# Patient Record
Sex: Female | Born: 1940 | Race: Black or African American | Hispanic: No | Smoking: Never smoker
Health system: Southern US, Community
[De-identification: ages and names within clinical notes are randomized; demographics above are authoritative.]

## PROBLEM LIST (undated history)

## (undated) DIAGNOSIS — C55 Malignant neoplasm of uterus, part unspecified: Secondary | ICD-10-CM

## (undated) DIAGNOSIS — I10 Essential (primary) hypertension: Secondary | ICD-10-CM

## (undated) DIAGNOSIS — E7800 Pure hypercholesterolemia, unspecified: Secondary | ICD-10-CM

## (undated) HISTORY — DX: Essential (primary) hypertension: I10

## (undated) HISTORY — PX: VAGINAL HYSTERECTOMY: SUR661

## (undated) HISTORY — DX: Pure hypercholesterolemia, unspecified: E78.00

## (undated) HISTORY — DX: Malignant neoplasm of uterus, part unspecified: C55

---

## 2006-11-12 ENCOUNTER — Emergency Department (HOSPITAL_COMMUNITY): Admission: EM | Admit: 2006-11-12 | Discharge: 2006-11-12 | Payer: Self-pay | Admitting: Emergency Medicine

## 2008-03-01 ENCOUNTER — Emergency Department (HOSPITAL_COMMUNITY): Admission: EM | Admit: 2008-03-01 | Discharge: 2008-03-01 | Payer: Self-pay | Admitting: Emergency Medicine

## 2010-08-28 ENCOUNTER — Emergency Department (HOSPITAL_COMMUNITY): Admission: EM | Admit: 2010-08-28 | Discharge: 2010-08-28 | Payer: Self-pay | Admitting: Emergency Medicine

## 2010-09-08 ENCOUNTER — Emergency Department (HOSPITAL_COMMUNITY): Admission: EM | Admit: 2010-09-08 | Discharge: 2010-09-08 | Payer: Self-pay | Admitting: Emergency Medicine

## 2011-01-04 LAB — CBC
HCT: 36.3 % (ref 36.0–46.0)
HCT: 38.6 % (ref 36.0–46.0)
Hemoglobin: 12.3 g/dL (ref 12.0–15.0)
Hemoglobin: 13 g/dL (ref 12.0–15.0)
MCH: 28.6 pg (ref 26.0–34.0)
MCH: 28.9 pg (ref 26.0–34.0)
MCHC: 33.9 g/dL (ref 30.0–36.0)
RBC: 4.54 MIL/uL (ref 3.87–5.11)

## 2011-01-04 LAB — BASIC METABOLIC PANEL
BUN: 10 mg/dL (ref 6–23)
CO2: 26 mEq/L (ref 19–32)
CO2: 28 mEq/L (ref 19–32)
Calcium: 9.6 mg/dL (ref 8.4–10.5)
Calcium: 9.9 mg/dL (ref 8.4–10.5)
Chloride: 100 mEq/L (ref 96–112)
GFR calc Af Amer: 45 mL/min — ABNORMAL LOW (ref >60)
Glucose, Bld: 124 mg/dL — ABNORMAL HIGH (ref 70–99)
Glucose, Bld: 135 mg/dL — ABNORMAL HIGH (ref 70–99)
Potassium: 2.8 mEq/L — ABNORMAL LOW (ref 3.5–5.1)
Potassium: 2.9 mEq/L — ABNORMAL LOW (ref 3.5–5.1)
Sodium: 138 mEq/L (ref 135–145)
Sodium: 138 mEq/L (ref 135–145)

## 2011-01-04 LAB — DIFFERENTIAL
Basophils Relative: 1 % (ref 0–1)
Eosinophils Absolute: 0 10*3/uL (ref 0.0–0.7)
Eosinophils Absolute: 0.1 10*3/uL (ref 0.0–0.7)
Lymphs Abs: 1.5 10*3/uL (ref 0.7–4.0)
Monocytes Absolute: 0.4 10*3/uL (ref 0.1–1.0)
Monocytes Absolute: 0.7 10*3/uL (ref 0.1–1.0)
Monocytes Relative: 4 % (ref 3–12)
Monocytes Relative: 8 % (ref 3–12)
Neutrophils Relative %: 83 % — ABNORMAL HIGH (ref 43–77)

## 2011-11-21 IMAGING — CR DG CHEST 1V PORT
1 series · 1 of 1 positions shown · non-contrast
Comparison: None.

CLINICAL DATA: Hypertension and nosebleed.

PORTABLE CHEST - 1 VIEW

[view not recorded]
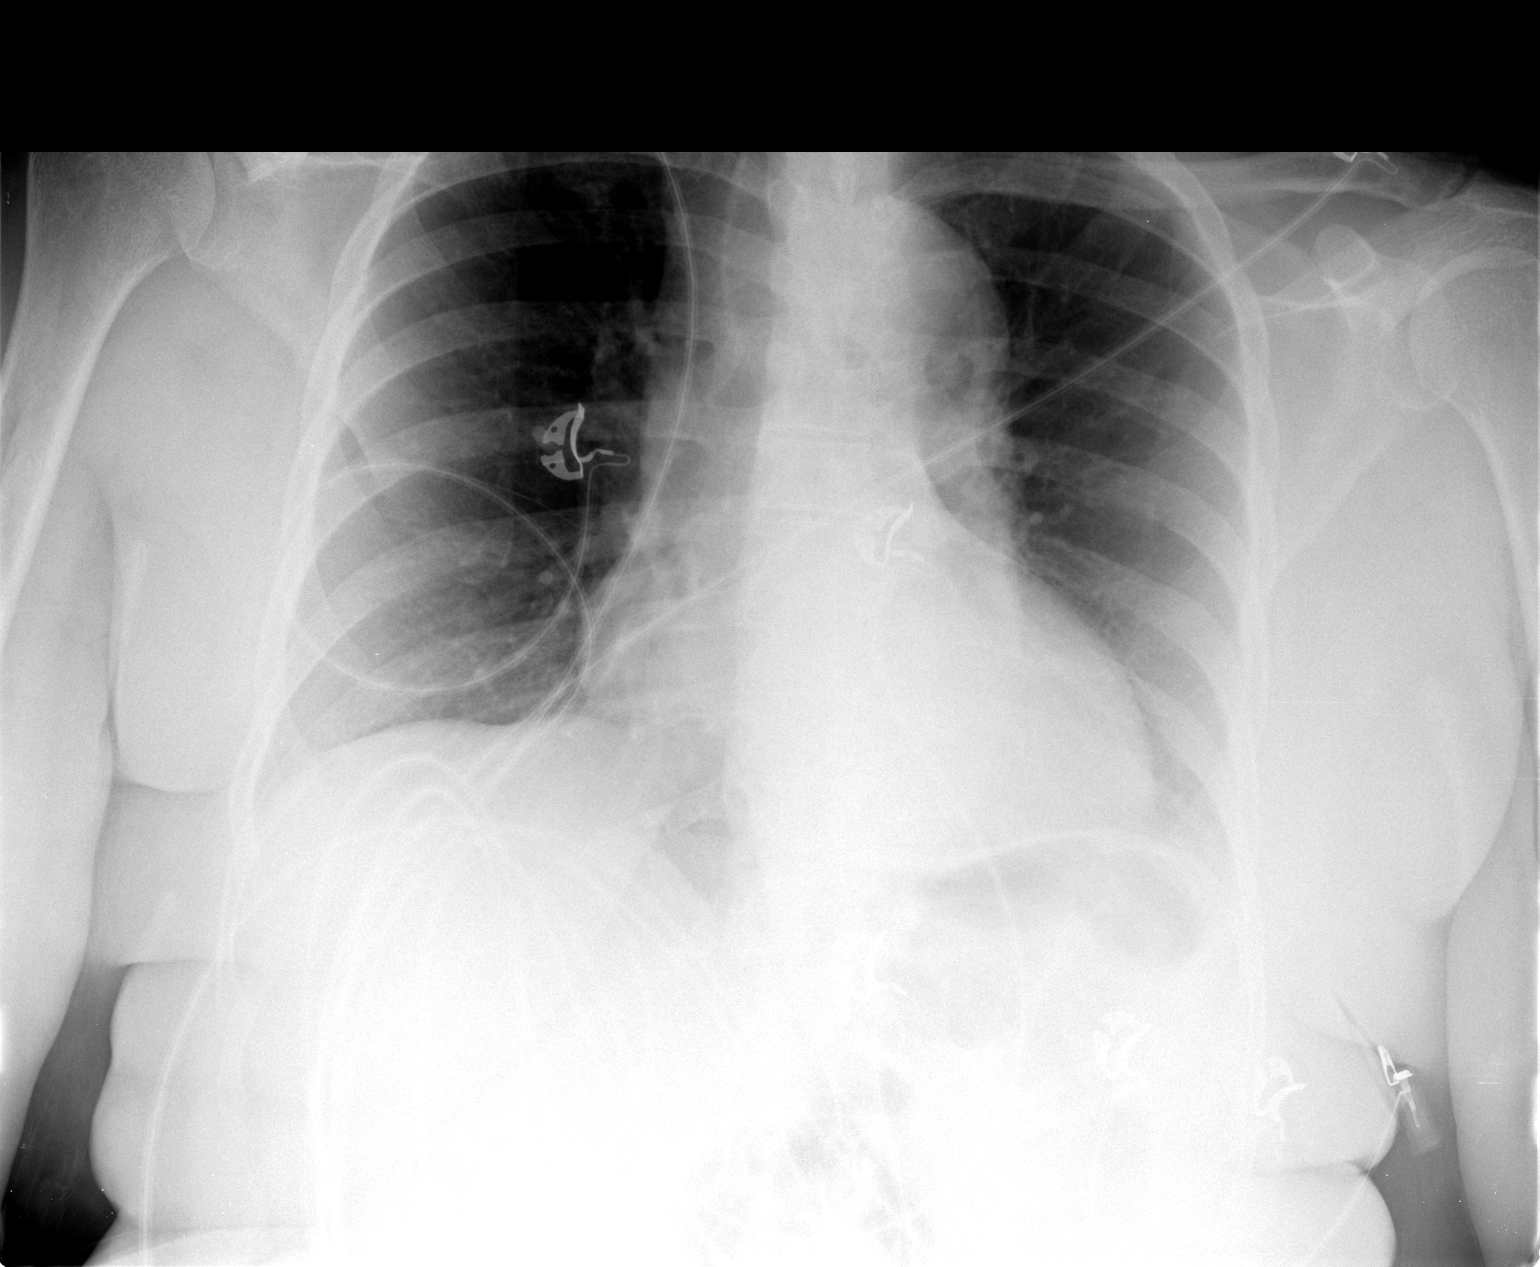

[1 of 1 positions shown; findings below may reference images not displayed]

FINDINGS: No evidence of pulmonary edema, infiltrate or pleural
fluid.  Thoracic aorta shows ectasia.  Heart size is normal.
IMPRESSION: No active disease.

## 2015-11-09 ENCOUNTER — Encounter: Payer: Self-pay | Admitting: Gastroenterology

## 2015-12-07 ENCOUNTER — Ambulatory Visit (AMBULATORY_SURGERY_CENTER): Payer: Self-pay

## 2015-12-07 VITALS — Ht 65.0 in | Wt 174.8 lb

## 2015-12-07 DIAGNOSIS — Z1211 Encounter for screening for malignant neoplasm of colon: Secondary | ICD-10-CM

## 2015-12-07 MED ORDER — SUPREP BOWEL PREP KIT 17.5-3.13-1.6 GM/177ML PO SOLN: 1.0000 | Freq: Once | ORAL | Status: DC

## 2015-12-07 NOTE — Progress Notes (Signed)
No allergies to eggs or soy No past problems with anesthesia No home oxygen No diet/weight loss meds  Has email and internet; registered for emmi 

## 2015-12-21 ENCOUNTER — Ambulatory Visit (AMBULATORY_SURGERY_CENTER): Payer: Medicare HMO | Admitting: Gastroenterology

## 2015-12-21 ENCOUNTER — Encounter: Payer: Self-pay | Admitting: Gastroenterology

## 2015-12-21 VITALS — BP 128/85 | HR 75 | Temp 98.3°F | Resp 18 | Ht 65.0 in | Wt 174.0 lb

## 2015-12-21 DIAGNOSIS — Z1211 Encounter for screening for malignant neoplasm of colon: Secondary | ICD-10-CM

## 2015-12-21 MED ORDER — SODIUM CHLORIDE 0.9 % IV SOLN: 500.0000 mL | INTRAVENOUS | Status: DC

## 2015-12-21 NOTE — Progress Notes (Signed)
A/ox3, pleased with MAC, report to RN 

## 2015-12-21 NOTE — Op Note (Signed)
[place]  [address]. [place], [id number]   COLONOSCOPY PROCEDURE REPORT  PATIENT: Katelyn, [facility]  MR#: [id number] BIRTHDATE: [birth year] , 74  yrs. old GENDER: female ENDOSCOPIST: Wilfrid Lund, MD REFERRED BY: PROCEDURE DATE:  12/21/2015 PROCEDURE:   Colonoscopy, screening First Screening Colonoscopy - Avg.  risk and is 50 yrs.  old or older Yes.  Prior Negative Screening - Now for repeat screening. N/A  History of Adenoma - Now for follow-up colonoscopy & has been > or = to 3 yrs.  N/A  Polyps removed today? No ASA CLASS:   Class II INDICATIONS:Screening for colonic neoplasia and Colorectal Neoplasm Risk Assessment for this procedure is average risk. MEDICATIONS: Monitored anesthesia care and Propofol 250 mg IV  DESCRIPTION OF PROCEDURE:   After the risks benefits and alternatives of the procedure were thoroughly explained, informed consent was obtained.  The digital rectal exam revealed no abnormalities of the rectum.   The LB [id number]  endoscope was introduced through the anus and advanced to the cecum, which was identified by both the appendix and ileocecal valve. No adverse events experienced.   The quality of the prep was excellent. (Suprep was used)  The instrument was then slowly withdrawn as the colon was fully examined. Estimated blood loss is zero unless otherwise noted in this procedure report.      COLON FINDINGS: There was moderate diverticulosis noted in the left colon and right colon.   Small Grade I hemorrhoids were found. Retroflexed views revealed internal Grade I hemorrhoids. The time to cecum =  6.30     Withdrawal time = 8.1   The scope was withdrawn and the procedure completed. COMPLICATIONS: There were no immediate complications.  ENDOSCOPIC IMPRESSION: 1.   There was moderate diverticulosis noted in the left colon and right colon 2.   Small Grade I hemorrhoids  RECOMMENDATIONS: Given your age, you will not need  another colonoscopy for colon cancer screening or polyp surveillance.  These types of tests usually stop around the age 42.  eSigned:  Wilfrid Lund, MD 12/21/2015 10:02 AM   cc:

## 2015-12-21 NOTE — Patient Instructions (Signed)
  Handout s given for Diverticulosis and Hemorroids.  YOU HAD AN ENDOSCOPIC PROCEDURE TODAY AT [facility]:   Refer to the procedure report that was given to you for any specific questions about what was found during the examination.  If the procedure report does not answer your questions, please call your gastroenterologist to clarify.  If you requested that your care partner not be given the details of your procedure findings, then the procedure report has been included in a sealed envelope for you to review at your convenience later.  YOU SHOULD EXPECT: Some feelings of bloating in the abdomen. Passage of more gas than usual.  Walking can help get rid of the air that was put into your GI tract during the procedure and reduce the bloating. If you had a lower endoscopy (such as a colonoscopy or flexible sigmoidoscopy) you may notice spotting of blood in your stool or on the toilet paper. If you underwent a bowel prep for your procedure, you may not have a normal bowel movement for a few days.  Please Note:  You might notice some irritation and congestion in your nose or some drainage.  This is from the oxygen used during your procedure.  There is no need for concern and it should clear up in a day or so.  SYMPTOMS TO REPORT IMMEDIATELY:   Following lower endoscopy (colonoscopy or flexible sigmoidoscopy):  Excessive amounts of blood in the stool  Significant tenderness or worsening of abdominal pains  Swelling of the abdomen that is new, acute  Fever of 100F or higher   For urgent or emergent issues, a gastroenterologist can be reached at any hour by calling [phone].   DIET: Your first meal following the procedure should be a small meal and then it is ok to progress to your normal diet. Heavy or fried foods are harder to digest and may make you feel nauseous or bloated.  Likewise, meals heavy in dairy and vegetables can increase bloating.  Drink plenty of fluids but you  should avoid alcoholic beverages for 24 hours.  ACTIVITY:  You should plan to take it easy for the rest of today and you should NOT DRIVE or use heavy machinery until tomorrow (because of the sedation medicines used during the test).    FOLLOW UP: Our staff will call the number listed on your records the next business day following your procedure to check on you and address any questions or concerns that you may have regarding the information given to you following your procedure. If we do not reach you, we will leave a message.  However, if you are feeling well and you are not experiencing any problems, there is no need to return our call.  We will assume that you have returned to your regular daily activities without incident.  If any biopsies were taken you will be contacted by phone or by letter within the next 1-3 weeks.  Please call us at [phone] if you have not heard about the biopsies in 3 weeks.    SIGNATURES/CONFIDENTIALITY: You and/or your care partner have signed paperwork which will be entered into your electronic medical record.  These signatures attest to the fact that that the information above on your After Visit Summary has been reviewed and is understood.  Full responsibility of the confidentiality of this discharge information lies with you and/or your care-partner.

## 2015-12-22 ENCOUNTER — Telehealth: Payer: Self-pay | Admitting: *Deleted

## 2015-12-22 NOTE — Telephone Encounter (Signed)
Message left

## 2023-07-12 ENCOUNTER — Emergency Department (HOSPITAL_COMMUNITY): Payer: Medicare HMO

## 2023-07-12 ENCOUNTER — Other Ambulatory Visit: Payer: Self-pay

## 2023-07-12 ENCOUNTER — Emergency Department (HOSPITAL_COMMUNITY)
Admission: EM | Admit: 2023-07-12 | Discharge: 2023-07-13 | Disposition: A | Discharge status: Home / Self Care | Payer: Medicare HMO | Attending: Emergency Medicine | Admitting: Emergency Medicine

## 2023-07-12 ENCOUNTER — Ambulatory Visit
Admission: EM | Admit: 2023-07-12 | Discharge: 2023-07-12 | Disposition: A | Discharge status: Home / Self Care | Payer: Medicare HMO | Attending: Internal Medicine | Admitting: Internal Medicine

## 2023-07-12 ENCOUNTER — Encounter (HOSPITAL_COMMUNITY): Payer: Self-pay | Admitting: Emergency Medicine

## 2023-07-12 DIAGNOSIS — I160 Hypertensive urgency: Secondary | ICD-10-CM

## 2023-07-12 DIAGNOSIS — Z8542 Personal history of malignant neoplasm of other parts of uterus: Secondary | ICD-10-CM | POA: Diagnosis not present

## 2023-07-12 DIAGNOSIS — I10 Essential (primary) hypertension: Secondary | ICD-10-CM | POA: Diagnosis not present

## 2023-07-12 DIAGNOSIS — Z79899 Other long term (current) drug therapy: Secondary | ICD-10-CM | POA: Insufficient documentation

## 2023-07-12 DIAGNOSIS — R519 Headache, unspecified: Secondary | ICD-10-CM | POA: Diagnosis present

## 2023-07-12 LAB — CBC WITH DIFFERENTIAL/PLATELET
Abs Immature Granulocytes: 0.03 10*3/uL (ref 0.00–0.07)
Basophils Absolute: 0 10*3/uL (ref 0.0–0.1)
Basophils Relative: 1 %
Eosinophils Absolute: 0.1 10*3/uL (ref 0.0–0.5)
Eosinophils Relative: 1 %
HCT: 45 % (ref 36.0–46.0)
Hemoglobin: 14.7 g/dL (ref 12.0–15.0)
Immature Granulocytes: 0 %
Lymphocytes Relative: 28 %
Lymphs Abs: 2.4 10*3/uL (ref 0.7–4.0)
MCH: 29 pg (ref 26.0–34.0)
MCHC: 32.7 g/dL (ref 30.0–36.0)
MCV: 88.8 fL (ref 80.0–100.0)
Monocytes Absolute: 0.6 10*3/uL (ref 0.1–1.0)
Monocytes Relative: 7 %
Neutro Abs: 5.3 10*3/uL (ref 1.7–7.7)
Neutrophils Relative %: 63 %
Platelets: 243 10*3/uL (ref 150–400)
RBC: 5.07 MIL/uL (ref 3.87–5.11)
RDW: 14.3 % (ref 11.5–15.5)
WBC: 8.5 10*3/uL (ref 4.0–10.5)
nRBC: 0 % (ref 0.0–0.2)

## 2023-07-12 LAB — COMPREHENSIVE METABOLIC PANEL WITH GFR
ALT: 20 U/L (ref 0–44)
AST: 26 U/L (ref 15–41)
Albumin: 3.8 g/dL (ref 3.5–5.0)
Alkaline Phosphatase: 51 U/L (ref 38–126)
Anion gap: 12 (ref 5–15)
BUN: 11 mg/dL (ref 8–23)
CO2: 26 mmol/L (ref 22–32)
Calcium: 9.3 mg/dL (ref 8.9–10.3)
Chloride: 100 mmol/L (ref 98–111)
Creatinine, Ser: 1.46 mg/dL — ABNORMAL HIGH (ref 0.44–1.00)
GFR, Estimated: 36 mL/min — ABNORMAL LOW (ref >60)
Glucose, Bld: 117 mg/dL — ABNORMAL HIGH (ref 70–99)
Potassium: 3.3 mmol/L — ABNORMAL LOW (ref 3.5–5.1)
Sodium: 138 mmol/L (ref 135–145)
Total Bilirubin: 1.2 mg/dL (ref 0.3–1.2)
Total Protein: 7.6 g/dL (ref 6.5–8.1)

## 2023-07-12 MED ORDER — AMLODIPINE BESYLATE 5 MG PO TABS: 5.0000 mg | ORAL_TABLET | Freq: Every day | ORAL | 1 refills | Status: DC

## 2023-07-12 MED ORDER — HYDRALAZINE HCL 20 MG/ML IJ SOLN
10.0000 mg | Freq: Once | INTRAMUSCULAR | Status: AC
  Administered 2023-07-12: 10 mg via INTRAVENOUS
  Filled 2023-07-12: qty 1

## 2023-07-12 MED ORDER — METOPROLOL SUCCINATE ER 25 MG PO TB24: 25.0000 mg | ORAL_TABLET | Freq: Every day | ORAL | 1 refills | Status: DC

## 2023-07-12 NOTE — ED Provider Triage Note (Signed)
Emergency Medicine Provider Triage Evaluation Note  Katelyn [facility] , a 82 y.o. female  was evaluated in triage.  Pt complains of hypertension.  Presents from urgent care.  She has been compliant with all of her medications.  She reports a mild headache but denies any vision changes, numbness, weakness, tingling, chest pain, shortness of breath, facial droop, slurred speech, difficulty walking.  Elevated blood pressure began 2 days ago  Review of Systems  Positive: As above Negative: As above  Physical Exam  BP (!) 212/124 (BP Location: Right Arm)   Pulse 80   Temp 98.7 F (37.1 C) (Oral)   Resp 18   Ht 5\' 5"  (1.651 m)   Wt 78.9 kg   SpO2 96%   BMI 28.96 kg/m  Gen:   Awake, no distress   Resp:  Normal effort  MSK:   Moves extremities without difficulty  Other:    MENTAL STATUS: AAOx3   LANG/SPEECH: Fluent, intact naming, repetition & comprehension   CRANIAL NERVES:   II: Pupils equal and reactive   III, IV, VI: EOM intact, no gaze preference or deviation, no nystagmus   V: normal sensation of the face   VII: no facial asymmetry   VIII: normal hearing to speech   MOTOR: 5/5 in both upper and lower extremities   SENSORY: Normal to touch in all extremiteis   COORD: Normal finger to nose, heel to shin and shoulder shrug, no tremor, no dysmetria. No pronator drift   Medical Decision Making  Medically screening exam initiated at 7:21 PM.  Appropriate orders placed.  Katelyn Pittman was informed that the remainder of the evaluation will be completed by another provider, this initial triage assessment does not replace that evaluation, and the importance of remaining in the ED until their evaluation is complete.  Workup initiated   Katelyn Pittman 07/12/23 1921

## 2023-07-12 NOTE — ED Provider Notes (Signed)
[place] EMER[facility]ENC[facility] DE[facility]MENT AT [facility] Provider Note   CSN: [id number] Arrival date & time: 07/12/23  1835     History  Chief Complaint  Patient presents with   Hypertension    Katelyn [facility] is a 82 y.o. female with past medical history significant for hypertension, previous uterine cancer who presents concern for significantly elevated blood pressure, systolic greater than 200, diastolic greater than 100 for the last 2 days.  She reports some slight headache onset today but denies any new numbness, weakness, tingling, chest pain, shortness of breath.  Denies any decreased urination.  She takes lisinopril 2.5mg  and reports that she has been taking this medication. Some grief related to loss of husband within the last year that she thinks may be contributing.   Hypertension       Home Medications Prior to Admission medications   Medication Sig Start Date End Date Taking? Authorizing Provider  amLODipine (NORVASC) 5 MG tablet Take 1 tablet (5 mg total) by mouth daily. 07/12/23  Yes Minnah Llamas H, PA-C  lisinopril (ZESTRIL) 2.5 MG tablet Take 1 tablet by mouth daily. 02/20/23   [provider]  metoprolol succinate (TOPROL-XL) 25 MG 24 hr tablet Take 1 tablet (25 mg total) by mouth at bedtime. 07/12/23   Effie Wahlert H, PA-C  rosuvastatin (CRESTOR) 20 MG tablet TAKE 1 TABLET BY MOUTH EVERY DAY. STOP SIMVASTATIN    [provider]      Allergies    Patient has no known allergies.    Review of Systems   Review of Systems  All other systems reviewed and are negative.   Physical Exam Updated Vital Signs BP (!) 157/102   Pulse 70   Temp 98.7 F (37.1 C) (Oral)   Resp 18   Ht 5\' 5"  (1.651 m)   Wt 78.9 kg   SpO2 97%   BMI 28.96 kg/m  Physical Exam Vitals and nursing note reviewed.  Constitutional:      General: She is not in acute distress.    Appearance: Normal appearance.  HENT:     Head: Normocephalic and atraumatic.   Eyes:     General:        Right eye: No discharge.        Left eye: No discharge.  Cardiovascular:     Rate and Rhythm: Normal rate and regular rhythm.     Heart sounds: No murmur heard.    No friction rub. No gallop.  Pulmonary:     Effort: Pulmonary effort is normal.     Breath sounds: Normal breath sounds.  Abdominal:     General: Bowel sounds are normal.     Palpations: Abdomen is soft.  Skin:    General: Skin is warm and dry.     Capillary Refill: Capillary refill takes less than 2 seconds.  Neurological:     Mental Status: She is alert and oriented to person, place, and time.     Comments: Moves all 4 limbs spontaneously, CN II through XII grossly intact, can ambulate without difficulty, intact sensation throughout.  Psychiatric:        Mood and Affect: Mood normal.        Behavior: Behavior normal.     ED Results / Procedures / Treatments   Labs (all labs ordered are listed, but only abnormal results are displayed) Labs Reviewed  COMPREHENSIVE METABOLIC PANEL - Abnormal; Notable for the following components:      Result Value   Potassium  3.3 (*)    Glucose, Bld 117 (*)    Creatinine, Ser 1.46 (*)    GFR, Estimated 36 (*)    All other components within normal limits  CBC WITH DIFFERENTIAL/PLATELET    EKG EKG Interpretation Date/Time:  Wednesday July 12 2023 21:36:25 EDT Ventricular Rate:  73 PR Interval:  173 QRS Duration:  92 QT Interval:  414 QTC Calculation: 457 R Axis:   -21  Text Interpretation: Sinus rhythm Borderline left axis deviation Anteroseptal infarct, old Confirmed by Alvino Blood ([id number]) on 07/12/2023 10:51:11 PM  Radiology CT Head Wo Contrast  Result Date: 07/12/2023 CLINICAL DATA:  Hypertension, headache, new onset EXAM: CT HEAD WITHOUT CONTRAST TECHNIQUE: Contiguous axial images were obtained from the base of the skull through the vertex without intravenous contrast. RADIATION DOSE REDUCTION: This exam was performed according  to the departmental dose-optimization program which includes automated exposure control, adjustment of the mA and/or kV according to patient size and/or use of iterative reconstruction technique. COMPARISON:  None Available. FINDINGS: Brain: No evidence of acute infarction, hemorrhage, mass, mass effect, or midline shift. No hydrocephalus or extra-axial fluid collection. Partial empty sella. Normal craniocervical junction. Better than expected cerebral volume for age. Gray-white differentiation is preserved. The basilar cisterns are patent. Vascular: No hyperdense vessel. Atherosclerotic calcifications in the intracranial carotid and vertebral arteries. Skull: Negative for fracture or focal lesion. Sinuses/Orbits: No acute finding.  Prior right lens replacement. Other: The mastoid air cells are well aerated. IMPRESSION: No acute intracranial process. Electronically Signed   By: Wiliam Ke M.D.   On: 07/12/2023 23:03    Procedures Procedures    Medications Ordered in ED Medications  hydrALAZINE (APRESOLINE) injection 10 mg (10 mg Intravenous Given 07/12/23 2235)    ED Course/ Medical Decision Making/ A&P                                 Medical Decision Making Amount and/or Complexity of Data Reviewed Radiology: ordered.  Risk Prescription drug management.   Medical Decision Making:   Crystianna [facility] is a 82 y.o. female who presented to the ED today with elevated blood pressure, mild headache detailed above.    Additional history discussed with patient's family/caregivers.  Patient's presentation is complicated by their history of uterine cancer, previous significant HTN.  Patient placed on continuous vitals and telemetry monitoring while in ED which was reviewed periodically.  Complete initial physical exam performed, notably the patient  was without any focal neurologic deficits, severely hypertensive on arrival, blood pressure 212/118, vital signs otherwise stable..    Reviewed and  confirmed nursing documentation for past medical history, family history, social history.    Initial Assessment:   With the patient's presentation of elevated blood pressure readings, most likely diagnosis is hypertensive urgency. Other diagnoses associated with hypertensive emergency were considered including (but not limited to) intracranial hemorrhage, acute renal artery stenosis, acute kidney injury, myocardial stress, ophthalmologic emergencies. These are considered less likely due to history of present illness and physical exam findings.   This is most consistent with an acute life/limb threatening illness complicated by underlying chronic conditions. Will evaluate for hypertensive emergency as below.  Initial Plan:   Screening labs including CBC and Metabolic panel to evaluate for infectious or metabolic etiology of disease.  Given headache, eval for ICH with CTH EKG to evaluate for cardiac pathology. Objective evaluation as below reviewed. Considered further administration of antihypertensives in ED, per  consensus guidelines for [employer] of emergency physicians, acute treatment of hypertensive urgency alone in the emergency department is not recommended.  If patient has evidence of hypertensive emergency on objective laboratory evaluation, will reevaluate.  Will monitor blood pressure while patient awaiting above laboratory studies.  Initial Study Results:   Laboratory  All laboratory results reviewed without evidence of clinically relevant pathology.   Exceptions include: Creatinine elevated 1.46 but overall stable compared to baseline, mild hypokalemia, potassium 3.3, encourage close PCP repeat check.  EKG EKG was reviewed independently. Rate, rhythm, axis, intervals all examined and without medically relevant abnormality. ST segments without concerns for elevations.    Radiology:  All images reviewed independently. Agree with radiology report at this time.   CT Head Wo  Contrast  Result Date: 07/12/2023 CLINICAL DATA:  Hypertension, headache, new onset EXAM: CT HEAD WITHOUT CONTRAST TECHNIQUE: Contiguous axial images were obtained from the base of the skull through the vertex without intravenous contrast. RADIATION DOSE REDUCTION: This exam was performed according to the departmental dose-optimization program which includes automated exposure control, adjustment of the mA and/or kV according to patient size and/or use of iterative reconstruction technique. COMPARISON:  None Available. FINDINGS: Brain: No evidence of acute infarction, hemorrhage, mass, mass effect, or midline shift. No hydrocephalus or extra-axial fluid collection. Partial empty sella. Normal craniocervical junction. Better than expected cerebral volume for age. Gray-white differentiation is preserved. The basilar cisterns are patent. Vascular: No hyperdense vessel. Atherosclerotic calcifications in the intracranial carotid and vertebral arteries. Skull: Negative for fracture or focal lesion. Sinuses/Orbits: No acute finding.  Prior right lens replacement. Other: The mastoid air cells are well aerated. IMPRESSION: No acute intracranial process. Electronically Signed   By: Wiliam Ke M.D.   On: 07/12/2023 23:03      Consults: Case discussed with attending physician, Dr. Suezanne Jacquet who assessed patient at bedside.   Reassessment and Plan:   After hydralazine 10 mg patient was significant improvement in blood pressure, repeat 157/102.  She no longer has a headache.  She continues to have no neurologic deficits, chest pain, shortness of breath.  We will increase her amlodipine 2.5 to 5 mg daily, as well as place her back on the extended release metoprolol which she was previously taking.  Encouraged close PCP follow-up, and blood pressure monitoring at home.  Final Clinical Impression(s) / ED Diagnoses Final diagnoses:  Hypertensive urgency  Poorly-controlled hypertension    Rx / DC Orders ED Discharge  Orders          Ordered    amLODipine (NORVASC) 5 MG tablet  Daily        07/12/23 2347    metoprolol succinate (TOPROL-XL) 25 MG 24 hr tablet  Daily at bedtime        07/12/23 2347              Tran Randle, Harrel Carina, PA-C 07/12/23 2351    Lonell Grandchild, MD 07/13/23 1816

## 2023-07-12 NOTE — ED Provider Notes (Signed)
[facility]  Note:  This document was prepared using Conservation officer, historic buildings and may include unintentional dictation errors.  MRN: [id number] DOB: June 16, 1941  Subjective:   Katelyn [facility] is a 82 y.o. female presenting for 2-day history of persistently elevated blood pressure greater than 200 systolic 100 diastolic. Patient reports that normally her blood pressure readings are 150s systolic over 80s diastolic.  She has also had left-sided headache.  No weakness, numbness or tingling, vision changes, confusion, falls, chest pain, heart racing or palpitations.  Patient is currently taking lisinopril 2.5 mg and metoprolol.  She has previously taken losartan, hydrochlorothiazide and amlodipine but has not been on these medications.  No history of heart disease, cerebrovascular disease, TIA, stroke, aneurysm.  No current facility-administered medications for this encounter.  Current Outpatient Medications:    lisinopril (ZESTRIL) 2.5 MG tablet, Take 1 tablet by mouth daily., Disp: , Rfl:    metoprolol succinate (TOPROL-XL) 25 MG 24 hr tablet, TAKE 1 TABLET BY MOUTH EVERY DAY AT BEDTIME FOR 30 DAYS, Disp: , Rfl:    rosuvastatin (CRESTOR) 20 MG tablet, TAKE 1 TABLET BY MOUTH EVERY DAY. STOP SIMVASTATIN, Disp: , Rfl:    No Known Allergies  Past Medical History:  Diagnosis Date   Hypertension    Uterine cancer (HCC)      Past Surgical History:  Procedure Laterality Date   VAGINAL HYSTERECTOMY      Family History  Problem Relation Age of Onset   Colon cancer Neg Hx    Esophageal cancer Neg Hx    Stomach cancer Neg Hx    Rectal cancer Neg Hx     Social History   Tobacco Use   Smoking status: Never   Smokeless tobacco: Never  Vaping Use   Vaping status: Never Used  Substance Use Topics   Alcohol use: No    Alcohol/week: 0.0 standard drinks of alcohol   Drug use: No    ROS   Objective:   Vitals: BP (!) 222/119 (BP Location: Right Arm)    Pulse 77   Temp 98.1 F (36.7 C) (Oral)   Resp 20   SpO2 95%   BP Readings from Last 3 Encounters:  07/12/23 (!) 222/119  12/21/15 128/85   Physical Exam Constitutional:      General: She is not in acute distress.    Appearance: Normal appearance. She is well-developed. She is not ill-appearing, toxic-appearing or diaphoretic.  HENT:     Head: Normocephalic and atraumatic.     Right Ear: External ear normal.     Left Ear: External ear normal.     Nose: Nose normal.     Mouth/Throat:     Mouth: Mucous membranes are moist.  Eyes:     General: No scleral icterus.       Right eye: No discharge.        Left eye: No discharge.     Extraocular Movements: Extraocular movements intact.     Pupils: Pupils are equal, round, and reactive to light.  Neck:     Meningeal: Brudzinski's sign and Kernig's sign absent.  Cardiovascular:     Rate and Rhythm: Normal rate and regular rhythm.     Heart sounds: Normal heart sounds. No murmur heard.    No friction rub. No gallop.  Pulmonary:     Effort: Pulmonary effort is normal. No respiratory distress.     Breath sounds: No stridor. No wheezing, rhonchi or rales.  Chest:  Chest wall: No tenderness.  Skin:    General: Skin is warm and dry.  Neurological:     General: No focal deficit present.     Mental Status: She is alert and oriented to person, place, and time.     Cranial Nerves: No cranial nerve deficit, dysarthria or facial asymmetry.     Motor: No weakness or pronator drift.     Coordination: Romberg sign negative. Coordination normal. Finger-Nose-Finger Test and Heel to [facility] Test normal. Rapid alternating movements normal.     Gait: Gait and tandem walk normal.     Deep Tendon Reflexes: Reflexes normal.  Psychiatric:        Mood and Affect: Mood normal.        Behavior: Behavior normal.        Thought Content: Thought content normal.        Judgment: Judgment normal.     Assessment and Plan :   PDMP not reviewed this  encounter.  1. Hypertensive urgency    Patient has a reassuring neurologic exam.  However, she is in need of a higher level of testing and care than we can provide in the urgent care setting.  She has severely elevated blood pressure and has developed a headache.  Differential does include hypertensive urgency, hypertensive emergency, TIA, stroke, aneurysm, acute encephalopathy.  I discussed this with patient and her daughter.  Will defer transport by EMS.  Patient is to present to the emergency room by personal vehicle.   Wallis Bamberg, [place] 07/12/23 1610

## 2023-07-12 NOTE — ED Triage Notes (Signed)
Patient arrives ambulatory by POV sent from UC for hypertension. Patient reports slight headache onset of today. Denies any numbness, weakness or tingling.

## 2023-07-12 NOTE — ED Triage Notes (Signed)
Pt states she checks BP daily-has been elevated x 2 days-NAD-steady gait

## 2023-07-12 NOTE — Discharge Instructions (Signed)
Please head to the emergency room now as you are in need of a higher level of care than we can provide in the urgent care setting. I am concerned that you have hypertensive urgency, signs of an TIA, stroke or aneurysm among other medical emergencies. This requires emergency level testing and care. Please head to the [facility] ER now.

## 2023-07-12 NOTE — Discharge Instructions (Addendum)
Please begin taking the new blood pressure medications that we have prescribed, recommend that you follow-up close with your primary care doctor, and keep a log of your blood pressures over the next month to see whether or not your blood pressure is responding adequately to the new blood pressure medications we have put you on.  If you have chest pain, develop new or worsening headache please return to the emergency department for further evaluation and management.

## 2023-07-12 NOTE — ED Notes (Signed)
Patient transported to CT 

## 2023-07-24 ENCOUNTER — Ambulatory Visit (INDEPENDENT_AMBULATORY_CARE_PROVIDER_SITE_OTHER): Payer: Medicare HMO | Admitting: Nurse Practitioner

## 2023-07-24 ENCOUNTER — Encounter: Payer: Self-pay | Admitting: Nurse Practitioner

## 2023-07-24 VITALS — BP 138/78 | HR 82 | Temp 97.3°F | Resp 18 | Ht 64.0 in | Wt 173.0 lb

## 2023-07-24 DIAGNOSIS — R413 Other amnesia: Secondary | ICD-10-CM | POA: Diagnosis not present

## 2023-07-24 DIAGNOSIS — I10 Essential (primary) hypertension: Secondary | ICD-10-CM | POA: Diagnosis not present

## 2023-07-24 DIAGNOSIS — E782 Mixed hyperlipidemia: Secondary | ICD-10-CM | POA: Diagnosis not present

## 2023-07-24 NOTE — Patient Instructions (Addendum)
To sign record release from your previous primary care.   To set up a pill box with your blood pressure medication  Take both medications in the morning.   Do not add salt to your food.

## 2023-07-24 NOTE — Progress Notes (Signed)
Careteam: Patient Care Team: Katelyn Seller, NP as PCP - General (Geriatric Medicine)  PLACE OF SERVICE:  [facility]  Advanced Directive information Does Patient Have a Medical Advance Directive?: No, Would patient like information on creating a medical advance directive?: No - Patient declined  No Known Allergies  Chief Complaint  Patient presents with   Establish Care    New patient to establish care     HPI: Patient is a 82 y.o. female to establish care.  Here with daughter in law Hypertension- taking norvasc 5 mg daily with metoprolol 25 mg by mouth at bedtime.   Reports the ED doctor told her cholesterol was high but she does not take medication for cholesterol- crestor on her list but she is not taking.   She had a hysterectomy - she does not know why.  She does not keep up with her medical history.  She lives with her grandson, he drives her wherever she wants to go. Takes her to the store and she still cooks.  Her husband recently passed away.   She was 7 when her parents passed away, does not know anything about their medical history.   She was seen in ED in due significantly elevated blood pressure, was given hydralazine and increased norvasc to 5 mg daily   She was last seen by her prior PCP in February/March.    Review of Systems:  Review of Systems  Constitutional:  Negative for chills, fever and weight loss.  HENT:  Negative for tinnitus.   Respiratory:  Negative for cough, sputum production and shortness of breath.   Cardiovascular:  Negative for chest pain, palpitations and leg swelling.  Gastrointestinal:  Negative for abdominal pain, constipation, diarrhea and heartburn.  Genitourinary:  Negative for dysuria, frequency and urgency.  Musculoskeletal:  Negative for back pain, falls, joint pain and myalgias.  Skin: Negative.   Neurological:  Negative for dizziness and headaches.  Psychiatric/Behavioral:  Negative for depression and memory loss.  The patient does not have insomnia.     Past Medical History:  Diagnosis Date   Elevated cholesterol    Hypertension    Past Surgical History:  Procedure Laterality Date   VAGINAL HYSTERECTOMY     Social History:   reports that she has never smoked. She has never used smokeless tobacco. She reports that she does not drink alcohol and does not use drugs.  Family History  Adopted: Yes  Problem Relation Age of Onset   Colon cancer Neg Hx    Esophageal cancer Neg Hx    Stomach cancer Neg Hx    Rectal cancer Neg Hx     Medications: Patient's Medications  New Prescriptions   No medications on file  Previous Medications   AMLODIPINE (NORVASC) 5 MG TABLET    Take 1 tablet (5 mg total) by mouth daily.   METOPROLOL SUCCINATE (TOPROL-XL) 25 MG 24 HR TABLET    Take 1 tablet (25 mg total) by mouth at bedtime.   MULTIPLE VITAMINS-MINERALS (MULTIMINERAL PLUS PO)    Take 1 tablet by mouth once.  Modified Medications   No medications on file  Discontinued Medications   LISINOPRIL (ZESTRIL) 2.5 MG TABLET    Take 1 tablet by mouth daily.   ROSUVASTATIN (CRESTOR) 20 MG TABLET    TAKE 1 TABLET BY MOUTH EVERY DAY. STOP SIMVASTATIN    Physical Exam:  Vitals:   07/24/23 1339  BP: 138/78  Pulse: 82  Resp: 18  Temp: (!)  97.3 F (36.3 C)  SpO2: 97%  Weight: 173 lb (78.5 kg)  Height: 5\' 4"  (1.626 m)   Body mass index is 29.7 kg/m. Wt Readings from Last 3 Encounters:  07/24/23 173 lb (78.5 kg)  07/12/23 174 lb (78.9 kg)  12/21/15 174 lb (78.9 kg)    Physical Exam Constitutional:      General: She is not in acute distress.    Appearance: She is well-developed. She is not diaphoretic.  HENT:     Head: Normocephalic and atraumatic.     Mouth/Throat:     Pharynx: No oropharyngeal exudate.  Eyes:     Conjunctiva/sclera: Conjunctivae normal.     Pupils: Pupils are equal, round, and reactive to light.  Cardiovascular:     Rate and Rhythm: Normal rate and regular rhythm.     Heart  sounds: Normal heart sounds.  Pulmonary:     Effort: Pulmonary effort is normal.     Breath sounds: Normal breath sounds.  Abdominal:     General: Bowel sounds are normal.     Palpations: Abdomen is soft.  Musculoskeletal:     Cervical back: Normal range of motion and neck supple.     Right lower leg: No edema.     Left lower leg: No edema.  Skin:    General: Skin is warm and dry.  Neurological:     Mental Status: She is alert.     Gait: Gait normal.  Psychiatric:        Mood and Affect: Mood normal.     Labs reviewed: Basic Metabolic Panel: Recent Labs    07/12/23 1927  NA 138  K 3.3*  CL 100  CO2 26  GLUCOSE 117*  BUN 11  CREATININE 1.46*  CALCIUM 9.3   Liver Function Tests: Recent Labs    07/12/23 1927  AST 26  ALT 20  ALKPHOS 51  BILITOT 1.2  PROT 7.6  ALBUMIN 3.8   No results for input(s): "LIPASE", "AMYLASE" in the last 8760 hours. No results for input(s): "AMMONIA" in the last 8760 hours. CBC: Recent Labs    07/12/23 1927  WBC 8.5  NEUTROABS 5.3  HGB 14.7  HCT 45.0  MCV 88.8  PLT 243   Lipid Panel: No results for input(s): "CHOL", "HDL", "LDLCALC", "TRIG", "CHOLHDL", "LDLDIRECT" in the last 8760 hours. TSH: No results for input(s): "TSH" in the last 8760 hours. A1C: No results found for: "HGBA1C"   Assessment/Plan 1. Primary hypertension -elevated on home readings but at goal in office Encouraged to take bp readings after medication Take both medications in the morning  Dash diet given  -advised not to add salt.  - Complete Metabolic Panel with eGFR  2. Mixed hyperlipidemia - Crestor on list but states she has not taken recently. Unsure about medication  - Lipid panel - Complete Metabolic Panel with eGFR  3. Memory loss Some impairment noted will follow up more at next visit    Return in about 2 months (around 09/23/2023) for follow up .  Katelyn Pittman. Biagio Borg [facility] [phone]

## 2023-07-25 LAB — COMPLETE METABOLIC PANEL WITH GFR
AG Ratio: 1.2 (calc) (ref 1.0–2.5)
ALT: 16 U/L (ref 6–29)
AST: 19 U/L (ref 10–35)
Albumin: 4.2 g/dL (ref 3.6–5.1)
Alkaline phosphatase (APISO): 54 U/L (ref 37–153)
BUN/Creatinine Ratio: 10 (calc) (ref 6–22)
BUN: 14 mg/dL (ref 7–25)
CO2: 29 mmol/L (ref 20–32)
Calcium: 10.2 mg/dL (ref 8.6–10.4)
Chloride: 101 mmol/L (ref 98–110)
Creat: 1.43 mg/dL — ABNORMAL HIGH (ref 0.60–0.95)
Globulin: 3.5 g/dL (ref 1.9–3.7)
Glucose, Bld: 94 mg/dL (ref 65–139)
Potassium: 4.2 mmol/L (ref 3.5–5.3)
Sodium: 140 mmol/L (ref 135–146)
Total Bilirubin: 0.8 mg/dL (ref 0.2–1.2)
Total Protein: 7.7 g/dL (ref 6.1–8.1)
eGFR: 37 mL/min/{1.73_m2} — ABNORMAL LOW (ref >=60)

## 2023-07-25 LAB — LIPID PANEL
Cholesterol: 196 mg/dL (ref <200)
HDL: 61 mg/dL (ref >=50)
LDL Cholesterol (Calc): 116 mg/dL — ABNORMAL HIGH
Non-HDL Cholesterol (Calc): 135 mg/dL — ABNORMAL HIGH (ref <130)
Total CHOL/HDL Ratio: 3.2 (calc) (ref <5.0)
Triglycerides: 89 mg/dL (ref <150)

## 2023-08-07 ENCOUNTER — Other Ambulatory Visit: Payer: Self-pay | Admitting: *Deleted

## 2023-08-07 MED ORDER — AMLODIPINE BESYLATE 5 MG PO TABS: 5.0000 mg | ORAL_TABLET | Freq: Every day | ORAL | 1 refills | Status: DC

## 2023-08-07 MED ORDER — METOPROLOL SUCCINATE ER 25 MG PO TB24: 25.0000 mg | ORAL_TABLET | Freq: Every day | ORAL | 1 refills | Status: DC

## 2023-08-07 NOTE — Telephone Encounter (Signed)
Patient called requesting refills

## 2023-08-11 ENCOUNTER — Ambulatory Visit: Payer: Medicare HMO | Admitting: Nurse Practitioner

## 2023-09-25 ENCOUNTER — Ambulatory Visit (INDEPENDENT_AMBULATORY_CARE_PROVIDER_SITE_OTHER): Payer: Medicare HMO | Admitting: Nurse Practitioner

## 2023-09-25 ENCOUNTER — Encounter: Payer: Self-pay | Admitting: Nurse Practitioner

## 2023-09-25 VITALS — BP 140/92 | HR 80 | Temp 97.1°F | Ht 64.0 in | Wt 173.0 lb

## 2023-09-25 DIAGNOSIS — N1832 Chronic kidney disease, stage 3b: Secondary | ICD-10-CM | POA: Diagnosis not present

## 2023-09-25 DIAGNOSIS — E782 Mixed hyperlipidemia: Secondary | ICD-10-CM

## 2023-09-25 DIAGNOSIS — I10 Essential (primary) hypertension: Secondary | ICD-10-CM

## 2023-09-25 DIAGNOSIS — R413 Other amnesia: Secondary | ICD-10-CM | POA: Diagnosis not present

## 2023-09-25 DIAGNOSIS — E2839 Other primary ovarian failure: Secondary | ICD-10-CM

## 2023-09-25 NOTE — Progress Notes (Signed)
Careteam: Patient Care Team: Katelyn Seller, NP as PCP - General (Geriatric Medicine)  PLACE OF SERVICE:  [facility]  Advanced Directive information Does Patient Have a Medical Advance Directive?: No, Would patient like information on creating a medical advance directive?: No - Patient declined  No Known Allergies  Chief Complaint  Patient presents with   Medical Management of Chronic Issues    2 month follow-up. Patient c/o of elevated blood pressure this afternoon 140/85. Patient declined all vaccine recommendations. Passed clock drawing associated with MMSE, 28/30.      HPI: Patient is a 82 y.o. female for routine follow up.   Reports blood pressure last night was 135/85.  Generally well controlled at home- she takes twice daily.  Taking metoprolol 25 mg daily with norvasc 5 mg daily  She gets nervous about coming to the doctors office.  No chest pains, shortness of breath or headaches.  She was not taking her crestor medication at last visit but now back on medication.   Does not take vaccines.   She has not had a bone density.   She does not drive never learned how because she used to live in [place] and always had public transportation.    Review of Systems:  Review of Systems  Constitutional:  Negative for chills, fever and weight loss.  HENT:  Negative for tinnitus.   Respiratory:  Negative for cough, sputum production and shortness of breath.   Cardiovascular:  Negative for chest pain, palpitations and leg swelling.  Gastrointestinal:  Negative for abdominal pain, constipation, diarrhea and heartburn.  Genitourinary:  Negative for dysuria, frequency and urgency.  Musculoskeletal:  Negative for back pain, falls, joint pain and myalgias.  Skin: Negative.   Neurological:  Negative for dizziness and headaches.  Psychiatric/Behavioral:  Negative for depression and memory loss. The patient does not have insomnia.     Past Medical History:  Diagnosis  Date   Elevated cholesterol    Hypertension    Past Surgical History:  Procedure Laterality Date   VAGINAL HYSTERECTOMY     Social History:   reports that she has never smoked. She has never used smokeless tobacco. She reports that she does not drink alcohol and does not use drugs.  Family History  Adopted: Yes  Problem Relation Age of Onset   Colon cancer Neg Hx    Esophageal cancer Neg Hx    Stomach cancer Neg Hx    Rectal cancer Neg Hx     Medications: Patient's Medications  New Prescriptions   No medications on file  Previous Medications   AMLODIPINE (NORVASC) 5 MG TABLET    Take 1 tablet (5 mg total) by mouth daily.   METOPROLOL SUCCINATE (TOPROL-XL) 25 MG 24 HR TABLET    Take 1 tablet (25 mg total) by mouth at bedtime.   MULTIPLE VITAMINS-MINERALS (MULTIMINERAL PLUS PO)    Take 1 tablet by mouth once.   ROSUVASTATIN (CRESTOR) 20 MG TABLET    Take 20 mg by mouth daily.  Modified Medications   No medications on file  Discontinued Medications   No medications on file    Physical Exam:  Vitals:   09/25/23 1538 09/25/23 1542  BP: (!) 144/90 (!) 140/92  Pulse: 80   Temp: (!) 97.1 F (36.2 C)   TempSrc: Temporal   SpO2: 98%   Weight: 173 lb (78.5 kg)   Height: 5\' 4"  (1.626 m)    Body mass index is 29.7 kg/m. Wt Readings  from Last 3 Encounters:  09/25/23 173 lb (78.5 kg)  07/24/23 173 lb (78.5 kg)  07/12/23 174 lb (78.9 kg)    Physical Exam Constitutional:      General: She is not in acute distress.    Appearance: She is well-developed. She is not diaphoretic.  HENT:     Head: Normocephalic and atraumatic.     Mouth/Throat:     Pharynx: No oropharyngeal exudate.  Eyes:     Conjunctiva/sclera: Conjunctivae normal.     Pupils: Pupils are equal, round, and reactive to light.  Cardiovascular:     Rate and Rhythm: Normal rate and regular rhythm.     Heart sounds: Normal heart sounds.  Pulmonary:     Effort: Pulmonary effort is normal.     Breath  sounds: Normal breath sounds.  Abdominal:     General: Bowel sounds are normal.     Palpations: Abdomen is soft.  Musculoskeletal:     Cervical back: Normal range of motion and neck supple.     Right lower leg: No edema.     Left lower leg: No edema.  Skin:    General: Skin is warm and dry.  Neurological:     Mental Status: She is alert and oriented to person, place, and time.  Psychiatric:        Mood and Affect: Mood normal.     Labs reviewed: Basic Metabolic Panel: Recent Labs    07/12/23 1927 07/24/23 1411  NA 138 140  K 3.3* 4.2  CL 100 101  CO2 26 29  GLUCOSE 117* 94  BUN 11 14  CREATININE 1.46* 1.43*  CALCIUM 9.3 10.2   Liver Function Tests: Recent Labs    07/12/23 1927 07/24/23 1411  AST 26 19  ALT 20 16  ALKPHOS 51  --   BILITOT 1.2 0.8  PROT 7.6 7.7  ALBUMIN 3.8  --    No results for input(s): "LIPASE", "AMYLASE" in the last 8760 hours. No results for input(s): "AMMONIA" in the last 8760 hours. CBC: Recent Labs    07/12/23 1927  WBC 8.5  NEUTROABS 5.3  HGB 14.7  HCT 45.0  MCV 88.8  PLT 243   Lipid Panel: Recent Labs    07/24/23 1411  CHOL 196  HDL 61  LDLCALC 116*  TRIG 89  CHOLHDL 3.2   TSH: No results for input(s): "TSH" in the last 8760 hours. A1C: No results found for: "HGBA1C"   Assessment/Plan 1. Primary hypertension --Blood pressure elevated today but typically well controlled -home blood pressures are well controlled -No changes to medications today  -will have pt continue to monitor home bp goal <140/90, to notify if readings remain high on 3 different days  -metabolic panel at follow up  2. Mixed hyperlipidemia Has restarted crestor, doing well without side effects Will follow up lab work at next visit   3. Memory loss Good historian today, MMSE 28/30- will follow. Continue supportive care  4. Stage 3b chronic kidney disease (HCC) -Chronic and stable Encourage proper hydration Follow metabolic panel Avoid  nephrotoxic meds (NSAIDS)  5. Estrogen deficiency - DG Bone Density; Future   Return in about 4 months (around 01/24/2024) for routine follow up,fasting labs at appt. [name]. [facility]agio [facility] [phone]

## 2023-09-25 NOTE — Patient Instructions (Signed)
To check blood pressure 1 hour AFTER you have had your medication Make sure you have been sitting at least 5 mins  Record and let us know.  Goal <140/90  You can bring your cuff to your next visit.

## 2023-10-16 ENCOUNTER — Encounter: Payer: Medicare HMO | Admitting: Nurse Practitioner

## 2023-10-30 ENCOUNTER — Ambulatory Visit (INDEPENDENT_AMBULATORY_CARE_PROVIDER_SITE_OTHER): Payer: Medicare HMO | Admitting: Nurse Practitioner

## 2023-10-30 ENCOUNTER — Encounter: Payer: Self-pay | Admitting: Nurse Practitioner

## 2023-10-30 DIAGNOSIS — Z0000 Encounter for general adult medical examination without abnormal findings: Secondary | ICD-10-CM | POA: Diagnosis not present

## 2023-10-30 MED ORDER — METOPROLOL SUCCINATE ER 25 MG PO TB24: 25.0000 mg | ORAL_TABLET | Freq: Every day | ORAL | 1 refills | Status: DC

## 2023-10-30 MED ORDER — AMLODIPINE BESYLATE 5 MG PO TABS: 5.0000 mg | ORAL_TABLET | Freq: Every day | ORAL | 1 refills | Status: DC

## 2023-10-30 MED ORDER — ROSUVASTATIN CALCIUM 20 MG PO TABS: 20.0000 mg | ORAL_TABLET | Freq: Every day | ORAL | 1 refills | Status: DC

## 2023-10-30 NOTE — Progress Notes (Signed)
 Subjective:   Katelyn Pittman is a 83 y.o. female who presents for Medicare Annual (Subsequent) preventive examination.  Visit Complete: Virtual I connected with  [name] on 10/30/23 by a video and audio enabled telemedicine application and verified that I am speaking with the correct person using two identifiers.  Patient Location: Home  Provider Location: Office/Clinic  I discussed the limitations of evaluation and management by telemedicine. The patient expressed understanding and agreed to proceed.  Vital Signs: Because this visit was a virtual/telehealth visit, some criteria may be missing or patient reported. Any vitals not documented were not able to be obtained and vitals that have been documented are patient reported.   Cardiac Risk Factors include: hypertension;dyslipidemia;advanced age (>14men, >79 women)     Objective:    There were no vitals filed for this visit. There is no height or weight on file to calculate BMI.     10/30/2023    2:03 PM 09/25/2023    3:49 PM 07/24/2023    1:43 PM 07/12/2023    6:57 PM 12/21/2015    8:42 AM 12/07/2015    8:08 AM  Advanced Directives  Does Patient Have a Medical Advance Directive? No No No No No No  Would patient like information on creating a medical advance directive? No - Patient declined No - Patient declined No - Patient declined   No - patient declined information    Current Medications (verified) Outpatient Encounter Medications as of 10/30/2023  Medication Sig   Multiple Vitamins-Minerals (MULTIMINERAL PLUS PO) Take 1 tablet by mouth once.   [DISCONTINUED] amLODipine  (NORVASC ) 5 MG tablet Take 1 tablet (5 mg total) by mouth daily.   [DISCONTINUED] metoprolol  succinate (TOPROL -XL) 25 MG 24 hr tablet Take 1 tablet (25 mg total) by mouth at bedtime.   [DISCONTINUED] rosuvastatin  (CRESTOR ) 20 MG tablet Take 20 mg by mouth daily.   amLODipine  (NORVASC ) 5 MG tablet Take 1 tablet (5 mg total) by mouth daily.   metoprolol   succinate (TOPROL -XL) 25 MG 24 hr tablet Take 1 tablet (25 mg total) by mouth at bedtime.   rosuvastatin  (CRESTOR ) 20 MG tablet Take 1 tablet (20 mg total) by mouth daily.   No facility-administered encounter medications on file as of 10/30/2023.    Allergies (verified) Patient has no known allergies.   History: Past Medical History:  Diagnosis Date   Elevated cholesterol    Hypertension    Past Surgical History:  Procedure Laterality Date   VAGINAL HYSTERECTOMY     Family History  Adopted: Yes  Problem Relation Age of Onset   Colon cancer Neg Hx    Esophageal cancer Neg Hx    Stomach cancer Neg Hx    Rectal cancer Neg Hx    Social History   Socioeconomic History   Marital status: Widowed    Spouse name: Not on file   Number of children: Not on file   Years of education: Not on file   Highest education level: 12th grade  Occupational History   Not on file  Tobacco Use   Smoking status: Never   Smokeless tobacco: Never  Vaping Use   Vaping status: Never Used  Substance and Sexual Activity   Alcohol use: No    Alcohol/week: 0.0 standard drinks of alcohol   Drug use: No   Sexual activity: Not on file  Other Topics Concern   Not on file  Social History Narrative   Not on file   Social Drivers of Health  Financial Resource Strain: Not on file  Food Insecurity: Not on file  Transportation Needs: Not on file  Physical Activity: Not on file  Stress: Not on file  Social Connections: Not on file    Tobacco Counseling Counseling given: Not Answered   Clinical Intake:  Pre-visit preparation completed: Yes  Pain : No/denies pain     BMI - recorded: 29 Nutritional Status: BMI 25 -29 Overweight Diabetes: No  How often do you need to have someone help you when you read instructions, pamphlets, or other written materials from your doctor or pharmacy?: 1 - Never         Activities of Daily Living    10/30/2023    2:22 PM 10/30/2023   12:03 PM  In  your present state of health, do you have any difficulty performing the following activities:  Hearing? 1 1  Vision? 0 0  Difficulty concentrating or making decisions? 0 0  Walking or climbing stairs? 0 0  Dressing or bathing? 0 0  Doing errands, shopping? 0 0  Preparing Food and eating ? N N  Using the Toilet? N N  In the past six months, have you accidently leaked urine? N N  Do you have problems with loss of bowel control? N N  Managing your Medications? N N  Managing your Finances? N N  Housekeeping or managing your Housekeeping? N N    Patient Care Team: Caro Harlene POUR, NP as PCP - General (Geriatric Medicine)  Indicate any recent Medical Services you may have received from other than [facility] providers in the past year (date may be approximate).     Assessment:   This is a routine wellness examination for Octa.  Hearing/Vision screen Hearing Screening - Comments:: Need hearing exam Vision Screening - Comments:: Eye exam needs glasses in 2024   Goals Addressed   None    Depression Screen    10/30/2023    2:01 PM 09/25/2023    3:37 PM 07/24/2023    1:43 PM  PHQ 2/9 Scores  PHQ - 2 Score 0 0 0  PHQ- 9 Score   0    Fall Risk    10/30/2023    2:00 PM 10/30/2023   12:03 PM 09/25/2023    3:37 PM 07/24/2023    1:43 PM  Fall Risk   Falls in the past year? 0 0 0 0  Number falls in past yr: 0  0 0  Injury with Fall? 0  0 0  Risk for fall due to :   No Fall Risks No Fall Risks  Follow up   Falls evaluation completed Falls evaluation completed    MEDICARE RISK AT HOME: Medicare Risk at Home Any stairs in or around the home?: Yes Home free of loose throw rugs in walkways, pet beds, electrical cords, etc?: Yes Adequate lighting in your home to reduce risk of falls?: Yes Life alert?: No Use of a cane, walker or w/c?: No Grab bars in the bathroom?: No Shower chair or bench in shower?: No Elevated toilet seat or a handicapped toilet?: No  TIMED UP AND GO:  Was the  test performed?  No    Cognitive Function:    09/25/2023    3:48 PM  MMSE - Mini Mental State Exam  Orientation to time 5  Orientation to Place 4  Registration 3  Attention/ Calculation 5  Recall 3  Language- name 2 objects 2  Language- repeat 1  Language- follow 3 step command  3  Language- read & follow direction 1  Write a sentence 1  Copy design 0  Total score 28        10/30/2023    2:04 PM  6CIT Screen  What Year? 0 points  What month? 0 points  What time? 0 points  Count back from 20 0 points  Months in reverse 0 points  Repeat phrase 0 points  Total Score 0 points    Immunizations Immunization History  Administered Date(s) Administered   Unspecified SARS-COV-2 Vaccination 04/07/2020    TDAP status: Due, Education has been provided regarding the importance of this vaccine. Advised may receive this vaccine at local pharmacy or Health Dept. Aware to provide a copy of the vaccination record if obtained from local pharmacy or Health Dept. Verbalized acceptance and understanding.  Flu Vaccine status: Declined, Education has been provided regarding the importance of this vaccine but patient still declined. Advised may receive this vaccine at local pharmacy or Health Dept. Aware to provide a copy of the vaccination record if obtained from local pharmacy or Health Dept. Verbalized acceptance and understanding.  Pneumococcal vaccine status: Declined,  Education has been provided regarding the importance of this vaccine but patient still declined. Advised may receive this vaccine at local pharmacy or Health Dept. Aware to provide a copy of the vaccination record if obtained from local pharmacy or Health Dept. Verbalized acceptance and understanding.   Covid-19 vaccine status: Declined, Education has been provided regarding the importance of this vaccine but patient still declined. Advised may receive this vaccine at local pharmacy or Health Dept.or vaccine clinic. Aware to  provide a copy of the vaccination record if obtained from local pharmacy or Health Dept. Verbalized acceptance and understanding.  Qualifies for Shingles Vaccine? Yes   Zostavax completed No   Shingrix Completed?: No.    Education has been provided regarding the importance of this vaccine. Patient has been advised to call insurance company to determine out of pocket expense if they have not yet received this vaccine. Advised may also receive vaccine at local pharmacy or Health Dept. Verbalized acceptance and understanding.  Screening Tests Health Maintenance  Topic Date Due   DEXA SCAN  Never done   INFLUENZA VACCINE  01/22/2024 (Originally 05/25/2023)   DTaP/Tdap/Td (1 - Tdap) 09/24/2024 (Originally 07/20/1960)   COVID-19 Vaccine (2 - 2024-25 season) 09/24/2024 (Originally 06/25/2023)   Pneumonia Vaccine 72+ Years old (1 of 2 - PCV) 10/24/2024 (Originally 07/21/1947)   Zoster Vaccines- Shingrix (1 of 2) 10/24/2024 (Originally 07/21/1991)   Medicare Annual Wellness (AWV)  10/29/2024   HPV VACCINES  Aged Out    Health Maintenance  Health Maintenance Due  Topic Date Due   DEXA SCAN  Never done    Colorectal cancer screening: No longer required.   Mammogram status: No longer required due to age.  Bone Density status: Ordered and scheduled. Pt provided with contact info and advised to call to schedule appt.  Lung Cancer Screening: (Low Dose CT Chest recommended if Age 49-80 years, 20 pack-year currently smoking OR have quit w/in 15years.) does not qualify.   Lung Cancer Screening Referral: na  Additional Screening:  Hepatitis C Screening: does not qualify  Vision Screening: Recommended annual ophthalmology exams for early detection of glaucoma and other disorders of the eye. Is the patient up to date with their annual eye exam?  Yes  Who is the provider or what is the name of the office in which the patient attends annual eye exams?  [place] unsure of name If pt is not established with  a provider, would they like to be referred to a provider to establish care? No .   Dental Screening: Recommended annual dental exams for proper oral hygiene   Community Resource Referral / Chronic Care Management: CRR required this visit?  No   CCM required this visit?  No     Plan:     I have personally reviewed and noted the following in the patient's chart:   Medical and social history Use of alcohol, tobacco or illicit drugs  Current medications and supplements including opioid prescriptions. Patient is not currently taking opioid prescriptions. Functional ability and status Nutritional status Physical activity Advanced directives List of other physicians Hospitalizations, surgeries, and ER visits in previous 12 months Vitals Screenings to include cognitive, depression, and falls Referrals and appointments  In addition, I have reviewed and discussed with patient certain preventive protocols, quality metrics, and best practice recommendations. A written personalized care plan for preventive services as well as general preventive health recommendations were provided to patient.     Harlene MARLA An, NP   10/30/2023   After Visit Summary: (MyChart) Due to this being a telephonic visit, the after visit summary with patients personalized plan was offered to patient via MyChart

## 2023-10-30 NOTE — Progress Notes (Signed)
   This service is provided via telemedicine  No vital signs collected/recorded due to the encounter was a telemedicine visit.   Location of patient (ex: home, work):  Home  Patient consents to a telephone visit: Yes  Location of the provider (ex: office, home):  [facility], Office   Name of any referring provider:  N/A  Names of all persons participating in the telemedicine service and their role in the encounter:  Shelli Ferrier, CMA, Patient, and Caro Harlene POUR, NP   Time spent on call:  9 min with medical assistant

## 2024-01-26 ENCOUNTER — Encounter: Payer: Self-pay | Admitting: Nurse Practitioner

## 2024-01-26 ENCOUNTER — Ambulatory Visit (INDEPENDENT_AMBULATORY_CARE_PROVIDER_SITE_OTHER): Payer: Medicare HMO | Admitting: Nurse Practitioner

## 2024-01-26 VITALS — BP 150/90 | HR 72 | Temp 97.5°F | Resp 16 | Ht 64.0 in | Wt 173.2 lb

## 2024-01-26 DIAGNOSIS — E782 Mixed hyperlipidemia: Secondary | ICD-10-CM | POA: Diagnosis not present

## 2024-01-26 DIAGNOSIS — N1832 Chronic kidney disease, stage 3b: Secondary | ICD-10-CM | POA: Diagnosis not present

## 2024-01-26 DIAGNOSIS — F4321 Adjustment disorder with depressed mood: Secondary | ICD-10-CM

## 2024-01-26 DIAGNOSIS — I10 Essential (primary) hypertension: Secondary | ICD-10-CM | POA: Diagnosis not present

## 2024-01-26 NOTE — Progress Notes (Signed)
 Careteam: Patient Care Team: Sharon Seller, NP as PCP - General (Geriatric Medicine)  PLACE OF SERVICE:  [facility]  Advanced Directive information Does Patient Have a Medical Advance Directive?: No, Would patient like information on creating a medical advance directive?: No - Patient declined  No Known Allergies  Chief Complaint  Patient presents with   Medical Management of Chronic Issues    4 month follow up.     Discussed the use of AI scribe software for clinical note transcription with the patient, who gave verbal consent to proceed.  History of Present Illness   Katelyn Pittman is a 83 year old female with hypertension who presents for a four month follow-up.   She reports her grandson, who helps with transportation and household tasks.  Her blood pressure was lower this morning when she checked it at home, but it was elevated during today's visit. She takes amlodipine and metoprolol in the morning, typically around 7:00 AM, before breakfast, and checks her blood pressure about an hour later, after eating. She does not keep a written record of her blood pressure readings but recalls recent readings being around 137/98. No headaches, blurred vision, shortness of breath, chest discomfort, or swelling in the legs.  She is on Crestor in the evening for cholesterol management.  She has been attending grief counseling weekly following the passing of her husband in December 2023, which has been helpful. She experiences mood changes and some depression, attributing it to adjusting to life without her husband of over forty years. Her son is undergoing surgery for prostate cancer, and her granddaughter, who is 52, recently had a stroke and is now in assisted living. These family stressors contribute to her emotional state.  She engages in physical activity by attending [employer] exercise classes three times a week and walking a mile on those days. On other days, she walks in  her neighborhood. She does not take aspirin and has no known allergies.      Review of Systems:  Review of Systems  Constitutional:  Negative for chills, fever and weight loss.  HENT:  Negative for tinnitus.   Respiratory:  Negative for cough, sputum production and shortness of breath.   Cardiovascular:  Negative for chest pain, palpitations and leg swelling.  Gastrointestinal:  Negative for abdominal pain, constipation, diarrhea and heartburn.  Genitourinary:  Negative for dysuria, frequency and urgency.  Musculoskeletal:  Negative for back pain, falls, joint pain and myalgias.  Skin: Negative.   Neurological:  Negative for dizziness and headaches.  Psychiatric/Behavioral:  Negative for depression and memory loss. The patient does not have insomnia.     Past Medical History:  Diagnosis Date   Elevated cholesterol    Hypertension    Past Surgical History:  Procedure Laterality Date   VAGINAL HYSTERECTOMY     Social History:   reports that she has never smoked. She has never used smokeless tobacco. She reports that she does not drink alcohol and does not use drugs.  Family History  Adopted: Yes  Problem Relation Age of Onset   Colon cancer Neg Hx    Esophageal cancer Neg Hx    Stomach cancer Neg Hx    Rectal cancer Neg Hx     Medications: Patient's Medications  New Prescriptions   No medications on file  Previous Medications   AMLODIPINE (NORVASC) 5 MG TABLET    Take 1 tablet (5 mg total) by mouth daily.   METOPROLOL SUCCINATE (TOPROL-XL) 25  MG 24 HR TABLET    Take 1 tablet (25 mg total) by mouth at bedtime.   MULTIPLE VITAMINS-MINERALS (MULTIMINERAL PLUS PO)    Take 1 tablet by mouth once.   ROSUVASTATIN (CRESTOR) 20 MG TABLET    Take 1 tablet (20 mg total) by mouth daily.  Modified Medications   No medications on file  Discontinued Medications   No medications on file    Physical Exam:  Vitals:   01/26/24 1430 01/26/24 2007  BP: (!) 162/100 (!) 150/90   Pulse: 72   Resp: 16   Temp: (!) 97.5 F (36.4 C)   SpO2: 95%   Weight: 173 lb 3.2 oz (78.6 kg)   Height: 5\' 4"  (1.626 m)    Body mass index is 29.73 kg/m. Wt Readings from Last 3 Encounters:  01/26/24 173 lb 3.2 oz (78.6 kg)  09/25/23 173 lb (78.5 kg)  07/24/23 173 lb (78.5 kg)    Physical Exam Constitutional:      General: She is not in acute distress.    Appearance: She is well-developed. She is not diaphoretic.  HENT:     Head: Normocephalic and atraumatic.     Mouth/Throat:     Pharynx: No oropharyngeal exudate.  Eyes:     Conjunctiva/sclera: Conjunctivae normal.     Pupils: Pupils are equal, round, and reactive to light.  Cardiovascular:     Rate and Rhythm: Normal rate and regular rhythm.     Heart sounds: Normal heart sounds.  Pulmonary:     Effort: Pulmonary effort is normal.     Breath sounds: Normal breath sounds.  Abdominal:     General: Bowel sounds are normal.     Palpations: Abdomen is soft.  Musculoskeletal:     Cervical back: Normal range of motion and neck supple.     Right lower leg: No edema.     Left lower leg: No edema.  Skin:    General: Skin is warm and dry.  Neurological:     Mental Status: She is alert.  Psychiatric:        Mood and Affect: Mood normal.     Labs reviewed: Basic Metabolic Panel: Recent Labs    07/12/23 1927 07/24/23 1411  NA 138 140  K 3.3* 4.2  CL 100 101  CO2 26 29  GLUCOSE 117* 94  BUN 11 14  CREATININE 1.46* 1.43*  CALCIUM 9.3 10.2   Liver Function Tests: Recent Labs    07/12/23 1927 07/24/23 1411  AST 26 19  ALT 20 16  ALKPHOS 51  --   BILITOT 1.2 0.8  PROT 7.6 7.7  ALBUMIN 3.8  --    No results for input(s): "LIPASE", "AMYLASE" in the last 8760 hours. No results for input(s): "AMMONIA" in the last 8760 hours. CBC: Recent Labs    07/12/23 1927  WBC 8.5  NEUTROABS 5.3  HGB 14.7  HCT 45.0  MCV 88.8  PLT 243   Lipid Panel: Recent Labs    07/24/23 1411  CHOL 196  HDL 61  LDLCALC  116*  TRIG 89  CHOLHDL 3.2   TSH: No results for input(s): "TSH" in the last 8760 hours. A1C: No results found for: "HGBA1C"   Assessment/Plan Assessment and Plan    Hypertension Blood pressure elevated today; home readings lower at 137/98. Stress and emotional factors likely contributing. - Recheck blood pressure before departure. - Instruct to record daily blood pressure readings and bring to next appointment or send via MyChart. -  Continue amlodipine and metoprolol. Hyperlipidemia On Crestor; blood work needed to assess current cholesterol levels. - Order blood work to assess cholesterol levels.  Grief Reports mood/depression related to life stressors and grief; benefits from weekly grief counseling. Declines medication. - Continue weekly grief counseling sessions.   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  [name]. [facility] [phone]

## 2024-01-26 NOTE — Patient Instructions (Signed)
 To check blood pressure 1 hour AFTER you have had your medication Make sure you have been sitting at least 5 mins  Record and let us know.  Goal <140/90  Please send me your blood pressure readings through your mychart so I can review.

## 2024-01-27 LAB — CBC WITH DIFFERENTIAL/PLATELET
Absolute Lymphocytes: 2999 {cells}/uL (ref 850–3900)
Absolute Monocytes: 823 {cells}/uL (ref 200–950)
Basophils Absolute: 42 {cells}/uL (ref 0–200)
Basophils Relative: 0.5 %
Eosinophils Absolute: 118 {cells}/uL (ref 15–500)
Eosinophils Relative: 1.4 %
HCT: 42.8 % (ref 35.0–45.0)
Hemoglobin: 13.9 g/dL (ref 11.7–15.5)
MCH: 29 pg (ref 27.0–33.0)
MCHC: 32.5 g/dL (ref 32.0–36.0)
MCV: 89.2 fL (ref 80.0–100.0)
MPV: 11.8 fL (ref 7.5–12.5)
Monocytes Relative: 9.8 %
Neutro Abs: 4418 {cells}/uL (ref 1500–7800)
Neutrophils Relative %: 52.6 %
Platelets: 247 10*3/uL (ref 140–400)
RBC: 4.8 10*6/uL (ref 3.80–5.10)
RDW: 13.5 % (ref 11.0–15.0)
Total Lymphocyte: 35.7 %
WBC: 8.4 10*3/uL (ref 3.8–10.8)

## 2024-01-27 LAB — COMPLETE METABOLIC PANEL WITHOUT GFR
AG Ratio: 1.4 (calc) (ref 1.0–2.5)
ALT: 21 U/L (ref 6–29)
AST: 25 U/L (ref 10–35)
Albumin: 4.3 g/dL (ref 3.6–5.1)
Alkaline phosphatase (APISO): 57 U/L (ref 37–153)
BUN/Creatinine Ratio: 10 (calc) (ref 6–22)
BUN: 13 mg/dL (ref 7–25)
CO2: 30 mmol/L (ref 20–32)
Calcium: 9.8 mg/dL (ref 8.6–10.4)
Chloride: 100 mmol/L (ref 98–110)
Creat: 1.35 mg/dL — ABNORMAL HIGH (ref 0.60–0.95)
Globulin: 3.1 g/dL (ref 1.9–3.7)
Glucose, Bld: 91 mg/dL (ref 65–139)
Potassium: 3.8 mmol/L (ref 3.5–5.3)
Sodium: 138 mmol/L (ref 135–146)
Total Bilirubin: 0.9 mg/dL (ref 0.2–1.2)
Total Protein: 7.4 g/dL (ref 6.1–8.1)

## 2024-01-27 LAB — LIPID PANEL
Cholesterol: 132 mg/dL (ref <200)
HDL: 65 mg/dL (ref >=50)
LDL Cholesterol (Calc): 53 mg/dL
Non-HDL Cholesterol (Calc): 67 mg/dL (ref <130)
Total CHOL/HDL Ratio: 2 (calc) (ref <5.0)
Triglycerides: 68 mg/dL (ref <150)

## 2024-01-29 ENCOUNTER — Encounter: Payer: Self-pay | Admitting: Nurse Practitioner

## 2024-03-21 ENCOUNTER — Ambulatory Visit
Admission: RE | Admit: 2024-03-21 | Discharge: 2024-03-21 | Disposition: A | Discharge status: Home / Self Care | Payer: Medicare HMO | Source: Ambulatory Visit | Attending: Nurse Practitioner | Admitting: Nurse Practitioner

## 2024-03-21 DIAGNOSIS — E2839 Other primary ovarian failure: Secondary | ICD-10-CM

## 2024-03-21 DIAGNOSIS — N958 Other specified menopausal and perimenopausal disorders: Secondary | ICD-10-CM | POA: Diagnosis not present

## 2024-03-25 ENCOUNTER — Ambulatory Visit: Payer: Self-pay | Admitting: Nurse Practitioner

## 2024-04-22 ENCOUNTER — Other Ambulatory Visit: Payer: Self-pay | Admitting: Nurse Practitioner

## 2024-06-25 DIAGNOSIS — H2513 Age-related nuclear cataract, bilateral: Secondary | ICD-10-CM | POA: Diagnosis not present

## 2024-06-26 DIAGNOSIS — H524 Presbyopia: Secondary | ICD-10-CM | POA: Diagnosis not present

## 2024-06-26 DIAGNOSIS — H52209 Unspecified astigmatism, unspecified eye: Secondary | ICD-10-CM | POA: Diagnosis not present

## 2024-06-26 DIAGNOSIS — H5213 Myopia, bilateral: Secondary | ICD-10-CM | POA: Diagnosis not present

## 2024-07-16 DIAGNOSIS — H903 Sensorineural hearing loss, bilateral: Secondary | ICD-10-CM | POA: Insufficient documentation

## 2024-08-17 ENCOUNTER — Other Ambulatory Visit: Payer: Self-pay | Admitting: Nurse Practitioner

## 2024-08-17 ENCOUNTER — Ambulatory Visit
Admission: RE | Admit: 2024-08-17 | Discharge: 2024-08-17 | Disposition: A | Source: Ambulatory Visit | Attending: Nurse Practitioner | Admitting: Nurse Practitioner

## 2024-08-17 DIAGNOSIS — Z1231 Encounter for screening mammogram for malignant neoplasm of breast: Secondary | ICD-10-CM

## 2024-08-20 ENCOUNTER — Other Ambulatory Visit: Payer: Self-pay | Admitting: Nurse Practitioner

## 2024-08-20 DIAGNOSIS — N6310 Unspecified lump in the right breast, unspecified quadrant: Secondary | ICD-10-CM

## 2024-08-21 ENCOUNTER — Encounter: Payer: Self-pay | Admitting: Sports Medicine

## 2024-08-21 ENCOUNTER — Ambulatory Visit: Admitting: Sports Medicine

## 2024-08-21 ENCOUNTER — Telehealth: Payer: Self-pay

## 2024-08-21 VITALS — BP 128/82 | HR 74 | Temp 98.2°F | Resp 16 | Ht 64.0 in | Wt 174.8 lb

## 2024-08-21 DIAGNOSIS — N6312 Unspecified lump in the right breast, upper inner quadrant: Secondary | ICD-10-CM | POA: Diagnosis not present

## 2024-08-21 NOTE — Telephone Encounter (Signed)
 She's getting imaging done at [facility] I thought

## 2024-08-21 NOTE — Telephone Encounter (Signed)
 Per Dr.Veludandi patient does not know where she had her last breast imaging done and we need to see if patient can be scheduled. Spoke with Ebony at the breast center and we called patient on 3-way as she had already left the office and appointment was scheduled.

## 2024-08-21 NOTE — Telephone Encounter (Signed)
 Dr.Veludandi is seeing this patient now and asked that I call the breast center to get more information. I called Kathern and she stated that patient has never been seen at there location and they would need previous mammogram results

## 2024-08-21 NOTE — Telephone Encounter (Signed)
 Copied from CRM [id number]. Topic: Clinical - Lab/Test Results >> Aug 21, 2024  8:49 AM Miquel SAILOR wrote: Reason for CRM: US  LIMITED ULTRASOUND INCLUDING AXILLA RIGHT BREAST (Order # [id number]) on 08/20/2024-Tatiana from breast center [phone] opt 1 then opt 2 Needs prior imaging before she gets scheduled. Needs call back on updated

## 2024-08-21 NOTE — Progress Notes (Signed)
 Careteam: Patient Care Team: Caro Harlene POUR, NP as PCP - General (Geriatric Medicine)  PLACE OF SERVICE:  [facility]  Advanced Directive information    No Known Allergies  Chief Complaint  Patient presents with   Medical Management of Chronic Issues    Needs imaigng done ' Pt declined flu and COVID vaccine      Discussed the use of AI scribe software for clinical note transcription with the patient, who gave verbal consent to proceed.  History of Present Illness    Ivylynn Christiansen is an 83 year old female who presents with breast mass   She has experienced swelling in her breast for approximately three months, with an increase in size and reports feeling a lump. There is no nipple discharge, fever, weight loss, or night sweats. No lumps in the axillary region or elsewhere on the body.   Denies chest pain, shortness of breath, or palpitations during physical activity, and she can walk a mile and a half at the gym without stopping. No gastrointestinal symptoms such as nausea, vomiting, abdominal pain, or changes in bowel habits. Appetite is normal, and there have been no recent changes in weight. No urinary symptoms such as dysuria or hematuria.  She has a family history of breast cancer   Review of Systems:  Review of Systems  Constitutional:  Negative for chills and fever.  HENT:  Negative for congestion and sore throat.   Respiratory:  Negative for cough, sputum production and shortness of breath.   Cardiovascular:  Negative for chest pain, palpitations and leg swelling.  Gastrointestinal:  Negative for abdominal pain, heartburn and nausea.  Genitourinary:  Negative for dysuria, frequency and hematuria.  Musculoskeletal:  Negative for falls and myalgias.   Negative unless indicated in HPI.   Past Medical History:  Diagnosis Date   Elevated cholesterol    Hypertension    Past Surgical History:  Procedure Laterality Date   VAGINAL HYSTERECTOMY     Social  History:   reports that she has never smoked. She has never used smokeless tobacco. She reports that she does not drink alcohol and does not use drugs.  Family History  Adopted: Yes  Problem Relation Age of Onset   Colon cancer Neg Hx    Esophageal cancer Neg Hx    Stomach cancer Neg Hx    Rectal cancer Neg Hx     Medications: Patient's Medications  New Prescriptions   No medications on file  Previous Medications   AMLODIPINE  (NORVASC ) 5 MG TABLET    TAKE 1 TABLET (5 MG TOTAL) BY MOUTH DAILY.   METOPROLOL  SUCCINATE (TOPROL -XL) 25 MG 24 HR TABLET    TAKE 1 TABLET BY MOUTH EVERYDAY AT BEDTIME   MULTIPLE VITAMINS-MINERALS (MULTIMINERAL PLUS PO)    Take 1 tablet by mouth once.   ROSUVASTATIN  (CRESTOR ) 20 MG TABLET    TAKE 1 TABLET BY MOUTH EVERY DAY  Modified Medications   No medications on file  Discontinued Medications   No medications on file    Physical Exam: Vitals:   08/21/24 0929  BP: 128/82  Pulse: 74  Resp: 16  Temp: 98.2 F (36.8 C)  SpO2: 96%  Weight: 174 lb 12.8 oz (79.3 kg)  Height: 5' 4 (1.626 m)   Body mass index is 30 kg/m. BP Readings from Last 3 Encounters:  08/21/24 128/82  01/26/24 (!) 150/90  09/25/23 (!) 140/92   Wt Readings from Last 3 Encounters:  08/21/24 174 lb 12.8 oz (79.3 kg)  01/26/24 173 lb 3.2 oz (78.6 kg)  09/25/23 173 lb (78.5 kg)    Physical Exam Constitutional:      Appearance: Normal appearance.  HENT:     Head: Normocephalic and atraumatic.  Cardiovascular:     Rate and Rhythm: Normal rate and regular rhythm.  Pulmonary:     Effort: Pulmonary effort is normal. No respiratory distress.     Breath sounds: Normal breath sounds. No wheezing.  Abdominal:     General: Bowel sounds are normal. There is no distension.     Tenderness: There is no abdominal tenderness. There is no guarding or rebound.     Comments:    Musculoskeletal:        General: No swelling.     Comments: Rt breast mass 4x 4 cm hard mass Slightly  mobile Retracted nipple No axillary lymphadenopathy Left 4th rib area hard mass felt anteriorly   Neurological:     Mental Status: She is alert. Mental status is at baseline.     Sensory: No sensory deficit.     Motor: No weakness.     Labs reviewed: Basic Metabolic Panel: Recent Labs    01/26/24 1459  NA 138  K 3.8  CL 100  CO2 30  GLUCOSE 91  BUN 13  CREATININE 1.35*  CALCIUM  9.8   Liver Function Tests: Recent Labs    01/26/24 1459  AST 25  ALT 21  BILITOT 0.9  PROT 7.4   No results for input(s): LIPASE, AMYLASE in the last 8760 hours. No results for input(s): AMMONIA in the last 8760 hours. CBC: Recent Labs    01/26/24 1459  WBC 8.4  NEUTROABS 4,418  HGB 13.9  HCT 42.8  MCV 89.2  PLT 247   Lipid Panel: Recent Labs    01/26/24 1459  CHOL 132  HDL 65  LDLCALC 53  TRIG 68  CHOLHDL 2.0   TSH: No results for input(s): TSH in the last 8760 hours. A1C: No results found for: HGBA1C  Assessment and Plan Assessment & Plan   1. Mass of upper inner quadrant of right breast (Primary) 4 x 4 cm hard mass felt in Rt breast area Rt nipple retraction  Pt says she noticed few months ago  No personal h/o breast cancer Her daughter has h/o breast cancer Orders for Dx Mammogram already in place  Will inform radiology to schedule for Dx mammogram

## 2024-09-06 ENCOUNTER — Ambulatory Visit
Admission: RE | Admit: 2024-09-06 | Discharge: 2024-09-06 | Disposition: A | Discharge status: Home / Self Care | Source: Ambulatory Visit | Attending: Nurse Practitioner | Admitting: Nurse Practitioner

## 2024-09-06 ENCOUNTER — Other Ambulatory Visit: Payer: Self-pay | Admitting: Nurse Practitioner

## 2024-09-06 DIAGNOSIS — N6310 Unspecified lump in the right breast, unspecified quadrant: Secondary | ICD-10-CM

## 2024-09-06 DIAGNOSIS — N6325 Unspecified lump in the left breast, overlapping quadrants: Secondary | ICD-10-CM | POA: Diagnosis not present

## 2024-09-06 DIAGNOSIS — R928 Other abnormal and inconclusive findings on diagnostic imaging of breast: Secondary | ICD-10-CM | POA: Diagnosis not present

## 2024-09-06 DIAGNOSIS — N6311 Unspecified lump in the right breast, upper outer quadrant: Secondary | ICD-10-CM | POA: Diagnosis not present

## 2024-09-07 ENCOUNTER — Other Ambulatory Visit: Payer: Self-pay | Admitting: Nurse Practitioner

## 2024-09-07 DIAGNOSIS — N6313 Unspecified lump in the right breast, lower outer quadrant: Secondary | ICD-10-CM

## 2024-09-07 DIAGNOSIS — N6310 Unspecified lump in the right breast, unspecified quadrant: Secondary | ICD-10-CM

## 2024-09-07 DIAGNOSIS — N6320 Unspecified lump in the left breast, unspecified quadrant: Secondary | ICD-10-CM

## 2024-09-07 DIAGNOSIS — N6312 Unspecified lump in the right breast, upper inner quadrant: Secondary | ICD-10-CM

## 2024-09-13 ENCOUNTER — Ambulatory Visit
Admission: RE | Admit: 2024-09-13 | Discharge: 2024-09-13 | Disposition: A | Discharge status: Home / Self Care | Source: Ambulatory Visit | Attending: Nurse Practitioner | Admitting: Nurse Practitioner

## 2024-09-13 DIAGNOSIS — N6320 Unspecified lump in the left breast, unspecified quadrant: Secondary | ICD-10-CM

## 2024-09-13 DIAGNOSIS — N6325 Unspecified lump in the left breast, overlapping quadrants: Secondary | ICD-10-CM | POA: Diagnosis not present

## 2024-09-13 DIAGNOSIS — N6310 Unspecified lump in the right breast, unspecified quadrant: Secondary | ICD-10-CM

## 2024-09-13 DIAGNOSIS — N6312 Unspecified lump in the right breast, upper inner quadrant: Secondary | ICD-10-CM | POA: Diagnosis not present

## 2024-09-13 DIAGNOSIS — N6313 Unspecified lump in the right breast, lower outer quadrant: Secondary | ICD-10-CM | POA: Diagnosis not present

## 2024-09-13 DIAGNOSIS — N6322 Unspecified lump in the left breast, upper inner quadrant: Secondary | ICD-10-CM | POA: Diagnosis not present

## 2024-09-13 HISTORY — PX: BREAST BIOPSY: SHX20

## 2024-09-16 LAB — SURGICAL PATHOLOGY

## 2024-09-20 ENCOUNTER — Encounter: Payer: Self-pay | Admitting: Genetic Counselor

## 2024-09-23 DIAGNOSIS — C50811 Malignant neoplasm of overlapping sites of right female breast: Secondary | ICD-10-CM | POA: Diagnosis not present

## 2024-09-23 DIAGNOSIS — C50812 Malignant neoplasm of overlapping sites of left female breast: Secondary | ICD-10-CM | POA: Diagnosis not present

## 2024-09-23 DIAGNOSIS — Z170 Estrogen receptor positive status [ER+]: Secondary | ICD-10-CM | POA: Diagnosis not present

## 2024-09-23 NOTE — Progress Notes (Signed)
 REFERRING PHYSICIAN:  Caro Harlene Carwin,* PROVIDER:  DONNICE CARLIN BURY, MD MRN: [id number] DOB: [birth year] DATE OF ENCOUNTER: 09/23/2024 Subjective    Chief Complaint: New Consultation   History of Present Illness: This is an 83 year old female who has not had a mammogram in some time and presented with changes to her right breast.  She underwent imaging that showed B density breast tissue.  On the left side there is a 2.6 cm mass in the upper breast.  There are multiple additional satellite masses extending towards the nipple spanning over 9.6 cm in anterior-posterior dimension.  She also has multiple prominent left axillary nodes which are symmetric bilaterally and at the upper limits of normal in cortical thickness.  These have not been biopsied.  On the left side she has undergone 2 biopsies.  Needle core biopsy was done at 11:00 with the ribbon clip that shows a grade 2 invasive mammary carcinoma.  The other biopsy at 12:00 shows an atypical papillary proliferation.  The characteristics of the left-sided tumor are that it is 95% ER positive, 100% PR positive, HER2 negative and her Ki-67 is 2%.  The right side ends up having a dominant 5.5 cm mass with associated nipple retraction.  There are multiple additional satellite masses that spanned about 9 cm.  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  The prognostic panel on the right side shows this to be 95% ER +10% PR positive HER2 negative and the KI is 10%.  The ductal carcinoma is 95% ER positive, 1% PR positive HER2 negative and the KI is 5%.  She is here with her daughter and granddaughter daughter to discuss her options today.    Review of Systems: A complete review of systems was obtained from the patient.  I have reviewed this information and discussed as appropriate  with the patient.  See HPI as well for other ROS.  Review of Systems  Neurological:  Positive for dizziness.  All other systems reviewed and are negative.    Medical History: Past Medical History:  Diagnosis Date  . Hypertension     There is no problem list on file for this patient.   History reviewed. No pertinent surgical history.   No Known Allergies  Current Outpatient Medications on File Prior to Visit  Medication Sig Dispense Refill  . amLODIPine  (NORVASC ) 5 MG tablet Take 5 mg by mouth once daily    . metoprolol  SUCCinate (TOPROL -XL) 25 MG XL tablet Take 25 mg by mouth at bedtime    . rosuvastatin  (CRESTOR ) 20 MG tablet Take 20 mg by mouth once daily     No current facility-administered medications on file prior to visit.    History reviewed. No pertinent family history.   Social History   Tobacco Use  Smoking Status Never  Smokeless Tobacco Never     Social History   Socioeconomic History  . Marital status: Widowed  Tobacco Use  . Smoking status: Never  . Smokeless tobacco: Never  Vaping Use  . Vaping status: Never Used  Substance and Sexual Activity  . Alcohol use: Never   Social Drivers of Corporate Investment Banker Strain: Low Risk  (08/20/2024)   Received from [facility]   Overall Financial Resource Strain (CARDIA)   . How hard is it for you to pay for  the very basics like food, housing, medical care, and heating?: Not very hard  Food Insecurity: No Food Insecurity (08/20/2024)   Received from [facility]   Hunger Vital Sign   . Within the past 12 months, you worried that your food would run out before you got the money to buy more.: Never true   . Within the past 12 months, the food you bought just didn't last and you didn't have money to get more.: Never true  Transportation Needs: No Transportation Needs (08/20/2024)   Received from [facility] - Transportation   . In the past 12 months, has lack of transportation kept you  from medical appointments or from getting medications?: No   . In the past 12 months, has lack of transportation kept you from meetings, work, or from getting things needed for daily living?: No  Physical Activity: Insufficiently Active (08/20/2024)   Received from [facility]   Exercise Vital Sign   . On average, how many days per week do you engage in moderate to strenuous exercise (like a brisk walk)?: 3 days   . On average, how many minutes do you engage in exercise at this level?: 30 min  Stress: Stress Concern Present (08/20/2024)   Received from [facility] - Occupational Stress Questionnaire   . Do you feel stress - tense, restless, nervous, or anxious, or unable to sleep at night because your mind is troubled all the time - these days?: To some extent  Social Connections: Moderately Integrated (08/20/2024)   Received from [facility]   Social Connection and Isolation Panel   . In a typical week, how many times do you talk on the phone with family, friends, or neighbors?: More than three times a week   . How often do you get together with friends or relatives?: Once a week   . How often do you attend church or religious services?: More than 4 times per year   . Do you belong to any clubs or organizations such as church groups, unions, fraternal or athletic groups, or school groups?: Yes   . How often do you attend meetings of the clubs or organizations you belong to?: More than 4 times per year   . Are you married, widowed, divorced, separated, never married, or living with a partner?: Widowed  Housing Stability: Unknown (09/23/2024)   Housing Stability Vital Sign   . Homeless in the Last Year: No    Objective:   Vitals:   09/23/24 1047  BP: (!) 178/102  Pulse: 89  Temp: 37.1 C (98.7 F)  SpO2: 99%  Weight: 78.4 kg (172 lb 12.8 oz)  Height: 165.1 cm (5' 5)  PainSc: 0-No pain    Body mass index is 28.76 kg/m.  Physical  Exam Vitals reviewed.  Constitutional:      Appearance: Normal appearance.  Chest:  Breasts:    Right: Inverted nipple (partially) and mass present. No nipple discharge.     Left: Mass present. No inverted nipple or nipple discharge.       Comments: Right breast mass over 5 cm with nipple retraction Left breast mass over 4 cm  Lymphadenopathy:     Upper Body:     Right upper body: No supraclavicular or axillary adenopathy.     Left upper body: No supraclavicular or axillary adenopathy.  Neurological:     Mental Status: She is alert.       Assessment  and Plan:     Diagnoses and all orders for this visit:  Bilateral malignant neoplasm of breast in female, estrogen receptor positive, unspecified site of breast (CMS/HHS-HCC) -     Ambulatory Referral to Oncology-Medical    We discussed at length the cancers on both sides.  I think if we are going to do surgery now she will require mastectomies on both sides.  There may be an option to do less surgery at some point.  I think she needs an MRI as well as some staging scans first.  I am sending her to oncology to have this done.  Once we figure that out we will go from there.  I do think that a primary antiestrogen for her for 6 months would be reasonable with follow-up.    MATTHEW CARLIN BURY, MD

## 2024-09-24 ENCOUNTER — Encounter: Payer: Self-pay | Admitting: *Deleted

## 2024-09-24 ENCOUNTER — Other Ambulatory Visit

## 2024-09-24 DIAGNOSIS — Z170 Estrogen receptor positive status [ER+]: Secondary | ICD-10-CM | POA: Insufficient documentation

## 2024-09-24 DIAGNOSIS — C50212 Malignant neoplasm of upper-inner quadrant of left female breast: Secondary | ICD-10-CM | POA: Insufficient documentation

## 2024-09-25 ENCOUNTER — Encounter: Payer: Self-pay | Admitting: *Deleted

## 2024-09-25 ENCOUNTER — Inpatient Hospital Stay: Attending: Hematology and Oncology | Admitting: Hematology and Oncology

## 2024-09-25 ENCOUNTER — Ambulatory Visit: Admitting: Nurse Practitioner

## 2024-09-25 DIAGNOSIS — Z1721 Progesterone receptor positive status: Secondary | ICD-10-CM | POA: Insufficient documentation

## 2024-09-25 DIAGNOSIS — C50211 Malignant neoplasm of upper-inner quadrant of right female breast: Secondary | ICD-10-CM | POA: Insufficient documentation

## 2024-09-25 DIAGNOSIS — C50911 Malignant neoplasm of unspecified site of right female breast: Secondary | ICD-10-CM | POA: Insufficient documentation

## 2024-09-25 DIAGNOSIS — Z79811 Long term (current) use of aromatase inhibitors: Secondary | ICD-10-CM | POA: Insufficient documentation

## 2024-09-25 DIAGNOSIS — C50912 Malignant neoplasm of unspecified site of left female breast: Secondary | ICD-10-CM | POA: Diagnosis not present

## 2024-09-25 DIAGNOSIS — Z170 Estrogen receptor positive status [ER+]: Secondary | ICD-10-CM | POA: Insufficient documentation

## 2024-09-25 MED ORDER — ANASTROZOLE 1 MG PO TABS: 1.0000 mg | ORAL_TABLET | Freq: Every day | ORAL | 3 refills | Status: AC

## 2024-09-25 NOTE — Progress Notes (Signed)
 [facility] CONSULT NOTE  Patient Care Team: Caro Harlene POUR, NP as PCP - General (Geriatric Medicine) Gerome Devere HERO, RN as Oncology Nurse Navigator  CHIEF COMPLAINTS/PURPOSE OF CONSULTATION:  Newly diagnosed breast cancer  HISTORY OF PRESENTING ILLNESS:  Katelyn Pittman 83 y.o. female is here because of recent diagnosis of bilateral breast cancer  I reviewed her records extensively and collaborated the history with the patient.  SUMMARY OF ONCOLOGIC HISTORY: Oncology History   No history exists.   Discussed the use of AI scribe software for clinical note transcription with the patient, who gave verbal consent to proceed.  History of Present Illness Katelyn Pittman is an 83 year old female with bilateral breast cancer who presents for oncology consultation. She is accompanied by her daughter, Darrick. She presents for further evaluation and management of her breast cancer.  Approximately three months ago, she discovered a lump in her right breast. Initially, she hoped it would resolve on its own, but it continued to grow, prompting her to seek medical evaluation. Subsequent imaging and biopsy confirmed bilateral breast cancer, with the right breast having a larger tumor burden compared to the left. On the right side, the largest tumor measures approximately 5.5 centimeters, with multiple smaller lesions present, indicating a multifocal pattern. The left breast has a largest tumor measuring 2.6 centimeters. 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   No back pain, rib pain, bone pain, persistent cough, or headaches. She does not recall the date of her last mammogram prior to the current diagnosis, indicating it has been many years since her last screening.  Her current medications include blood pressure medication, which she  continues to take without issue.  Family history is significant for breast cancer, as her daughter had the condition.  Socially, she participates in exercise classes, including Silver Sneakers.  Rest of the pertinent 10 point ROS reviewed and neg.  MEDICAL HISTORY:  Past Medical History:  Diagnosis Date   Elevated cholesterol    Hypertension     SURGICAL HISTORY: Past Surgical History:  Procedure Laterality Date   BREAST BIOPSY Left 09/13/2024   US  LT BREAST BX W LOC DEV EA ADD LESION IMG BX SPEC US  GUIDE 09/13/2024 [facility]   BREAST BIOPSY Left 09/13/2024   US  LT BREAST BX W LOC DEV 1ST LESION IMG BX SPEC US  GUIDE 09/13/2024 [facility]   BREAST BIOPSY Right 09/13/2024   US  RT BREAST BX W LOC DEV 1ST LESION IMG BX SPEC US  GUIDE 09/13/2024 [facility]   BREAST BIOPSY Right 09/13/2024   US  RT BREAST BX W LOC DEV EA ADD LESION IMG BX SPEC US  GUIDE 09/13/2024 [facility]   VAGINAL HYSTERECTOMY      SOCIAL HISTORY: Social History   Socioeconomic History   Marital status: Widowed    Spouse name: Not on file   Number of children: Not on file   Years of education: Not on file   Highest education level: 11th grade  Occupational History   Not on file  Tobacco Use   Smoking status: Never   Smokeless tobacco: Never  Vaping Use   Vaping status: Never Used  Substance and Sexual Activity   Alcohol use: No    Alcohol/week: 0.0 standard drinks of alcohol   Drug use: No   Sexual activity: Not on file  Other Topics Concern   Not on file  Social History Narrative   Not on file   Social Drivers of Health   Financial Resource Strain: Low Risk  (08/20/2024)   Overall Financial Resource Strain (CARDIA)    Difficulty of Paying Living Expenses: Not very hard  Food Insecurity: No Food Insecurity (09/25/2024)   Hunger Vital Sign    Worried About Running Out of Food in the Last Year: Never true    Ran Out of Food in the Last Year: Never true   Transportation Needs: No Transportation Needs (09/25/2024)   PRAPARE - Administrator, Civil Service (Medical): No    Lack of Transportation (Non-Medical): No  Physical Activity: Insufficiently Active (08/20/2024)   Exercise Vital Sign    Days of Exercise per Week: 3 days    Minutes of Exercise per Session: 30 min  Stress: Stress Concern Present (08/20/2024)   [employer] of Occupational Health - Occupational Stress Questionnaire    Feeling of Stress: To some extent  Social Connections: Moderately Integrated (08/20/2024)   Social Connection and Isolation Panel    Frequency of Communication with Friends and Family: More than three times a week    Frequency of Social Gatherings with Friends and Family: Once a week    Attends Religious Services: More than 4 times per year    Active Member of [employer] or Organizations: Yes    Attends Banker Meetings: More than 4 times per year    Marital Status: Widowed  Intimate Partner Violence: Not At Risk (01/26/2024)   Humiliation, Afraid, Rape, and Kick questionnaire    Fear of Current or Ex-Partner: No    Emotionally Abused: No    Physically Abused: No    Sexually Abused: No    FAMILY HISTORY: Family History  Adopted: Yes  Problem Relation Age of Onset   Breast cancer Daughter    Prostate cancer Son    Stroke Granddaughter 35   Colon cancer Neg Hx    Esophageal cancer Neg Hx    Stomach cancer Neg Hx    Rectal cancer Neg Hx     ALLERGIES:  has no known allergies.  MEDICATIONS:  Current Outpatient Medications  Medication Sig Dispense Refill   amLODipine  (NORVASC ) 5 MG tablet TAKE 1 TABLET (5 MG TOTAL) BY MOUTH DAILY. 90 tablet 1   metoprolol  succinate (TOPROL -XL) 25 MG 24 hr tablet TAKE 1 TABLET BY MOUTH EVERYDAY AT BEDTIME 90 tablet 1   Multiple Vitamins-Minerals (MULTIMINERAL PLUS PO) Take 1 tablet by mouth once.     rosuvastatin  (CRESTOR ) 20 MG tablet TAKE 1 TABLET BY MOUTH EVERY DAY 90 tablet 1   No  current facility-administered medications for this visit.    PHYSICAL EXAMINATION: ECOG PERFORMANCE STATUS: 0 - Asymptomatic  Vitals:   09/25/24 1551  BP: (!) 167/95  Pulse: 76  Resp: 18  Temp: 98 F (36.7 C)  SpO2: 97%   Filed Weights   09/25/24 1551  Weight: 172 lb 1.6 oz (78.1 kg)    GENERAL:alert, no distress and comfortable No cervical or axillary adenopathy Large right breast mass measuring approximately 8 cms occupying most of the upper quadrant with nipple retraction upwards. Mass remains mobile. In the left breast 2-3 cm mass noted in upper inner quadrant No palpable adenopathy bilaterally. CTA bilaterally RRR No LE edema.  LABORATORY DATA:  I have reviewed the data as listed Lab Results  Component Value Date   WBC 8.4 01/26/2024   HGB 13.9 01/26/2024   HCT 42.8 01/26/2024  MCV 89.2 01/26/2024   PLT 247 01/26/2024   Lab Results  Component Value Date   NA 138 01/26/2024   K 3.8 01/26/2024   CL 100 01/26/2024   CO2 30 01/26/2024    RADIOGRAPHIC STUDIES: I have personally reviewed the radiological reports and agreed with the findings in the report.  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 Right breast tumor is 5.5 cm with multiple smaller lesions. 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   - Given very large tumor and bilateral breast cancer, we agreed to do systemic staging - Given strong ER PR positivity in most of the tumor with low Ki, her 2 neg, I dont think neoadj chemo will cause significant response. I think its reasonable to try neoadj endocrine therapy since she also wants to have a smaller surgery if possible. - Prescribed anastrozole daily, discussed AE including hot flashes, vaginal dryness, bone density loss etc. - Schedule  follow-up in 3 months to assess response to hormone therapy. - Discussed genetic testing , pt declined. - Arranged lab appointment for tumor marker evaluation on the same day as PET scan. Baseline labs and tumor markers ordered. - If she notices worsening of the tumor while on anti estrogen therapy, she was clearly instructed to call us  and return to clinic right away  Time spent: 60 min including H and P, review of records, counseling, documentation.  All questions were answered. The patient knows to call the clinic with any problems, questions or concerns.    Amber Stalls, MD 09/25/24

## 2024-09-26 ENCOUNTER — Encounter: Payer: Self-pay | Admitting: *Deleted

## 2024-09-26 DIAGNOSIS — Z170 Estrogen receptor positive status [ER+]: Secondary | ICD-10-CM

## 2024-09-26 NOTE — Progress Notes (Signed)
 Met patient and daughter yesterday at initial med onc appt with Dr. Loretha. Reviewed role of navigator and she has my contact information.  AI med sent to pharmacy and they were to pick it up and patient start on that med. F/u appt made for Dr. Loretha for 3 months from now.   PET scan scheduled now for 630am (615 am arrival time on 12/12 at [facility] hospital).  Was scheduled for 2pm that day but when I spoke to daughter Amado she stated the 0630 appt time would be better for her mom. Amado stated no pacer or implanted medical devices in her mom. She is aware no food or drink other than water for 6 hours prior to test.  Labs scheduled at [facility] for after PET scan. Tawanna able to verbalize understand and repeat back appt times. She has my information if needed.

## 2024-09-26 NOTE — Progress Notes (Signed)
 Spoke to [employer] and PET scheduled for 12/12 arrival time to [facility] at 145. Has labs ordered prior at [facility] lab. Call made to daughter Amado to make her aware. Message left and requested call back.  Contacted Dr. Ebbie about the breast MRI he requested. Order placed.

## 2024-10-01 ENCOUNTER — Inpatient Hospital Stay: Admitting: Hematology and Oncology

## 2024-10-04 ENCOUNTER — Encounter (HOSPITAL_COMMUNITY)
Admission: RE | Admit: 2024-10-04 | Discharge: 2024-10-04 | Disposition: A | Source: Ambulatory Visit | Attending: Hematology and Oncology

## 2024-10-04 ENCOUNTER — Inpatient Hospital Stay

## 2024-10-04 DIAGNOSIS — Z170 Estrogen receptor positive status [ER+]: Secondary | ICD-10-CM | POA: Insufficient documentation

## 2024-10-04 DIAGNOSIS — C50211 Malignant neoplasm of upper-inner quadrant of right female breast: Secondary | ICD-10-CM | POA: Diagnosis not present

## 2024-10-04 LAB — CBC WITH DIFFERENTIAL/PLATELET
Abs Immature Granulocytes: 0.01 K/uL (ref 0.00–0.07)
Basophils Absolute: 0 K/uL (ref 0.0–0.1)
Basophils Relative: 0 %
Eosinophils Absolute: 0 K/uL (ref 0.0–0.5)
Eosinophils Relative: 0 %
HCT: 41.4 % (ref 36.0–46.0)
Hemoglobin: 14 g/dL (ref 12.0–15.0)
Immature Granulocytes: 0 %
Lymphocytes Relative: 24 %
Lymphs Abs: 2 K/uL (ref 0.7–4.0)
MCH: 29 pg (ref 26.0–34.0)
MCHC: 33.8 g/dL (ref 30.0–36.0)
MCV: 85.9 fL (ref 80.0–100.0)
Monocytes Absolute: 0.6 K/uL (ref 0.1–1.0)
Monocytes Relative: 7 %
Neutro Abs: 5.5 K/uL (ref 1.7–7.7)
Neutrophils Relative %: 69 %
Platelets: 230 K/uL (ref 150–400)
RBC: 4.82 MIL/uL (ref 3.87–5.11)
RDW: 14.4 % (ref 11.5–15.5)
WBC: 8.1 K/uL (ref 4.0–10.5)
nRBC: 0 % (ref 0.0–0.2)

## 2024-10-04 LAB — CMP (CANCER CENTER ONLY)
ALT: 25 U/L (ref 0–44)
AST: 30 U/L (ref 15–41)
Albumin: 4.2 g/dL (ref 3.5–5.0)
Alkaline Phosphatase: 57 U/L (ref 38–126)
Anion gap: 10 (ref 5–15)
BUN: 11 mg/dL (ref 8–23)
CO2: 28 mmol/L (ref 22–32)
Calcium: 9.6 mg/dL (ref 8.9–10.3)
Chloride: 103 mmol/L (ref 98–111)
Creatinine: 1.4 mg/dL — ABNORMAL HIGH (ref 0.44–1.00)
GFR, Estimated: 37 mL/min — ABNORMAL LOW (ref >60)
Glucose, Bld: 111 mg/dL — ABNORMAL HIGH (ref 70–99)
Potassium: 3.3 mmol/L — ABNORMAL LOW (ref 3.5–5.1)
Sodium: 141 mmol/L (ref 135–145)
Total Bilirubin: 1.2 mg/dL (ref 0.0–1.2)
Total Protein: 7.8 g/dL (ref 6.5–8.1)

## 2024-10-04 LAB — GLUCOSE, CAPILLARY: Glucose-Capillary: 106 mg/dL — ABNORMAL HIGH (ref 70–99)

## 2024-10-04 MED ORDER — FLUDEOXYGLUCOSE F - 18 (FDG) INJECTION
8.5000 | Freq: Once | INTRAVENOUS | Status: AC | PRN
  Administered 2024-10-04: 8.5 via INTRAVENOUS

## 2024-10-05 LAB — CANCER ANTIGEN 27.29: CA 27.29: 15.6 U/mL (ref 0.0–38.6)

## 2024-10-05 LAB — CANCER ANTIGEN 15-3: CA 15-3: 13.9 U/mL (ref 0.0–25.0)

## 2024-10-07 ENCOUNTER — Telehealth: Payer: Self-pay | Admitting: Licensed Clinical Social Worker

## 2024-10-07 NOTE — Telephone Encounter (Signed)
[facility] Clinical Social Work  Clinical Social Work was referred by new patient protocol for assessment of psychosocial needs.  Clinical Social Worker attempted to contact patient by phone  to offer support and assess for needs.   No answer. Left VM with direct contact information.      Emelia Sandoval E ZAVALA, LCSW  Clinical Social Worker [facility]        

## 2024-10-10 ENCOUNTER — Ambulatory Visit: Payer: Self-pay | Admitting: Hematology and Oncology

## 2024-10-14 NOTE — Telephone Encounter (Signed)
 Attempted to call pt's daughter, Amado with results per MD. LVM for call back to review.

## 2024-10-14 NOTE — Telephone Encounter (Signed)
-----   Message from Amber Stalls, MD sent at 10/10/2024  4:59 PM EST ----- NO evidence of metastatic disease. She has bilateral breast cancer.  Thanks,

## 2024-10-21 ENCOUNTER — Inpatient Hospital Stay: Admission: RE | Admit: 2024-10-21 | Source: Ambulatory Visit

## 2024-10-23 ENCOUNTER — Other Ambulatory Visit: Payer: Self-pay | Admitting: Nurse Practitioner

## 2024-11-04 ENCOUNTER — Encounter: Payer: Medicare HMO | Admitting: Nurse Practitioner

## 2024-11-06 ENCOUNTER — Ambulatory Visit
Admission: RE | Admit: 2024-11-06 | Discharge: 2024-11-06 | Disposition: A | Discharge status: Home / Self Care | Source: Ambulatory Visit | Attending: General Surgery | Admitting: General Surgery

## 2024-11-06 DIAGNOSIS — Z170 Estrogen receptor positive status [ER+]: Secondary | ICD-10-CM

## 2024-11-06 MED ORDER — GADOPICLENOL 0.5 MMOL/ML IV SOLN
7.5000 mL | Freq: Once | INTRAVENOUS | Status: AC | PRN
  Administered 2024-11-06: 7.5 mL via INTRAVENOUS

## 2024-11-08 ENCOUNTER — Encounter: Payer: Self-pay | Admitting: *Deleted

## 2024-12-24 ENCOUNTER — Inpatient Hospital Stay: Admitting: Hematology and Oncology
